# Patient Record
Sex: Male | Born: 1945
Health system: Southern US, Community
[De-identification: ages and names within clinical notes are randomized; demographics above are authoritative.]

## PROBLEM LIST (undated history)

## (undated) DIAGNOSIS — C109 Malignant neoplasm of oropharynx, unspecified: Secondary | ICD-10-CM

## (undated) DIAGNOSIS — F4310 Post-traumatic stress disorder, unspecified: Secondary | ICD-10-CM

## (undated) DIAGNOSIS — M1990 Unspecified osteoarthritis, unspecified site: Secondary | ICD-10-CM

## (undated) DIAGNOSIS — Z01810 Encounter for preprocedural cardiovascular examination: Secondary | ICD-10-CM

## (undated) DIAGNOSIS — N189 Chronic kidney disease, unspecified: Secondary | ICD-10-CM

## (undated) DIAGNOSIS — F32A Depression, unspecified: Secondary | ICD-10-CM

## (undated) DIAGNOSIS — E785 Hyperlipidemia, unspecified: Secondary | ICD-10-CM

## (undated) DIAGNOSIS — K219 Gastro-esophageal reflux disease without esophagitis: Secondary | ICD-10-CM

## (undated) DIAGNOSIS — E039 Hypothyroidism, unspecified: Secondary | ICD-10-CM

## (undated) DIAGNOSIS — I2510 Atherosclerotic heart disease of native coronary artery without angina pectoris: Secondary | ICD-10-CM

## (undated) DIAGNOSIS — F419 Anxiety disorder, unspecified: Secondary | ICD-10-CM

## (undated) DIAGNOSIS — I209 Angina pectoris, unspecified: Secondary | ICD-10-CM

## (undated) DIAGNOSIS — K449 Diaphragmatic hernia without obstruction or gangrene: Secondary | ICD-10-CM

## (undated) DIAGNOSIS — I10 Essential (primary) hypertension: Secondary | ICD-10-CM

## (undated) DIAGNOSIS — G4730 Sleep apnea, unspecified: Secondary | ICD-10-CM

## (undated) DIAGNOSIS — K5790 Diverticulosis of intestine, part unspecified, without perforation or abscess without bleeding: Secondary | ICD-10-CM

## (undated) HISTORY — DX: Hypothyroidism, unspecified: E03.9

## (undated) HISTORY — PX: ANAL FISTULOTOMY: SHX6423

## (undated) HISTORY — DX: Diverticulosis of intestine, part unspecified, without perforation or abscess without bleeding: K57.90

## (undated) HISTORY — DX: Anxiety disorder, unspecified: F41.9

## (undated) HISTORY — DX: Post-traumatic stress disorder, unspecified: F43.10

## (undated) HISTORY — DX: Sleep apnea, unspecified: G47.30

## (undated) HISTORY — PX: HEMORRHOID SURGERY: SHX153

## (undated) HISTORY — DX: Diaphragmatic hernia without obstruction or gangrene: K44.9

## (undated) HISTORY — PX: ARTHROSCOPY KNEE W/ DRILLING: SUR92

## (undated) HISTORY — DX: Gastro-esophageal reflux disease without esophagitis: K21.9

## (undated) HISTORY — PX: CORONARY ANGIOPLASTY: SHX604

---
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
## 2001-04-28 HISTORY — PX: CARDIAC CATHETERIZATION: SHX172

## 2004-04-28 HISTORY — PX: TONSILLECTOMY: SUR1361

## 2004-07-16 ENCOUNTER — Ambulatory Visit: Admission: RE | Admit: 2004-07-16 | Discharge: 2004-10-14 | Payer: Self-pay | Admitting: Radiation Oncology

## 2004-07-17 ENCOUNTER — Ambulatory Visit (HOSPITAL_COMMUNITY): Admission: RE | Admit: 2004-07-17 | Discharge: 2004-07-17 | Payer: Self-pay | Admitting: Radiation Oncology

## 2004-07-18 ENCOUNTER — Ambulatory Visit: Payer: Self-pay | Admitting: Internal Medicine

## 2004-07-18 ENCOUNTER — Encounter: Admission: AD | Admit: 2004-07-18 | Discharge: 2004-07-18 | Payer: Self-pay | Admitting: Dentistry

## 2004-07-18 ENCOUNTER — Ambulatory Visit: Payer: Self-pay | Admitting: Dentistry

## 2004-07-22 ENCOUNTER — Ambulatory Visit: Payer: Self-pay | Admitting: Dentistry

## 2004-07-22 ENCOUNTER — Ambulatory Visit (HOSPITAL_COMMUNITY): Admission: RE | Admit: 2004-07-22 | Discharge: 2004-07-22 | Payer: Self-pay | Admitting: Radiation Oncology

## 2004-09-04 ENCOUNTER — Ambulatory Visit: Payer: Self-pay | Admitting: Internal Medicine

## 2004-10-22 ENCOUNTER — Ambulatory Visit: Payer: Self-pay | Admitting: Internal Medicine

## 2004-11-06 ENCOUNTER — Ambulatory Visit: Payer: Self-pay | Admitting: Dentistry

## 2004-11-21 ENCOUNTER — Ambulatory Visit: Admission: RE | Admit: 2004-11-21 | Discharge: 2004-11-21 | Payer: Self-pay | Admitting: Radiation Oncology

## 2004-12-17 ENCOUNTER — Ambulatory Visit: Payer: Self-pay | Admitting: Internal Medicine

## 2005-03-18 ENCOUNTER — Ambulatory Visit: Payer: Self-pay | Admitting: Internal Medicine

## 2005-07-08 ENCOUNTER — Ambulatory Visit: Payer: Self-pay | Admitting: Internal Medicine

## 2005-07-11 ENCOUNTER — Ambulatory Visit (HOSPITAL_COMMUNITY): Admission: RE | Admit: 2005-07-11 | Discharge: 2005-07-11 | Payer: Self-pay | Admitting: Internal Medicine

## 2005-11-06 ENCOUNTER — Ambulatory Visit: Payer: Self-pay | Admitting: Internal Medicine

## 2005-11-12 LAB — CBC WITH DIFFERENTIAL/PLATELET
Basophils Absolute: 0 10*3/uL (ref 0.0–0.1)
Eosinophils Absolute: 0.1 10*3/uL (ref 0.0–0.5)
HCT: 42 % (ref 38.7–49.9)
HGB: 14.6 g/dL (ref 13.0–17.1)
MCH: 31.7 pg (ref 28.0–33.4)
MCV: 91.4 fL (ref 81.6–98.0)
NEUT#: 4 10*3/uL (ref 1.5–6.5)
NEUT%: 77.6 % — ABNORMAL HIGH (ref 40.0–75.0)
RDW: 13.6 % (ref 11.2–14.6)
lymph#: 0.6 10*3/uL — ABNORMAL LOW (ref 0.9–3.3)

## 2005-11-12 LAB — COMPREHENSIVE METABOLIC PANEL
Albumin: 4.6 g/dL (ref 3.5–5.2)
BUN: 16 mg/dL (ref 6–23)
Calcium: 9.6 mg/dL (ref 8.4–10.5)
Chloride: 102 mEq/L (ref 96–112)
Creatinine, Ser: 1.03 mg/dL (ref 0.40–1.50)
Glucose, Bld: 145 mg/dL — ABNORMAL HIGH (ref 70–99)
Potassium: 3.8 mEq/L (ref 3.5–5.3)

## 2006-02-02 ENCOUNTER — Ambulatory Visit: Payer: Self-pay | Admitting: Internal Medicine

## 2006-02-06 ENCOUNTER — Ambulatory Visit (HOSPITAL_COMMUNITY): Admission: RE | Admit: 2006-02-06 | Discharge: 2006-02-06 | Payer: Self-pay | Admitting: Internal Medicine

## 2006-02-17 ENCOUNTER — Ambulatory Visit (HOSPITAL_COMMUNITY): Admission: RE | Admit: 2006-02-17 | Discharge: 2006-02-17 | Payer: Self-pay | Admitting: Internal Medicine

## 2006-06-26 ENCOUNTER — Ambulatory Visit: Payer: Self-pay | Admitting: Internal Medicine

## 2006-07-01 LAB — COMPREHENSIVE METABOLIC PANEL
AST: 23 U/L (ref 0–37)
Albumin: 4.5 g/dL (ref 3.5–5.2)
BUN: 20 mg/dL (ref 6–23)
CO2: 25 mEq/L (ref 19–32)
Calcium: 9.7 mg/dL (ref 8.4–10.5)
Chloride: 104 mEq/L (ref 96–112)
Potassium: 3.8 mEq/L (ref 3.5–5.3)

## 2006-07-01 LAB — CBC WITH DIFFERENTIAL/PLATELET
Basophils Absolute: 0 10*3/uL (ref 0.0–0.1)
EOS%: 2.4 % (ref 0.0–7.0)
Eosinophils Absolute: 0.2 10*3/uL (ref 0.0–0.5)
HCT: 42.6 % (ref 38.7–49.9)
HGB: 14.9 g/dL (ref 13.0–17.1)
MCH: 31.3 pg (ref 28.0–33.4)
MONO#: 0.5 10*3/uL (ref 0.1–0.9)
NEUT#: 5.7 10*3/uL (ref 1.5–6.5)
RDW: 13.6 % (ref 11.2–14.6)
WBC: 7.1 10*3/uL (ref 4.0–10.0)
lymph#: 0.7 10*3/uL — ABNORMAL LOW (ref 0.9–3.3)

## 2006-07-03 ENCOUNTER — Ambulatory Visit (HOSPITAL_COMMUNITY): Admission: RE | Admit: 2006-07-03 | Discharge: 2006-07-03 | Payer: Self-pay | Admitting: Internal Medicine

## 2006-12-25 ENCOUNTER — Ambulatory Visit: Payer: Self-pay | Admitting: Internal Medicine

## 2006-12-30 LAB — CBC WITH DIFFERENTIAL/PLATELET
BASO%: 0.4 % (ref 0.0–2.0)
Basophils Absolute: 0 10*3/uL (ref 0.0–0.1)
EOS%: 3.3 % (ref 0.0–7.0)
HGB: 14.5 g/dL (ref 13.0–17.1)
MCH: 32 pg (ref 28.0–33.4)
MCHC: 35.4 g/dL (ref 32.0–35.9)
RDW: 13.8 % (ref 11.2–14.6)
WBC: 5.8 10*3/uL (ref 4.0–10.0)
lymph#: 0.7 10*3/uL — ABNORMAL LOW (ref 0.9–3.3)

## 2006-12-30 LAB — COMPREHENSIVE METABOLIC PANEL
ALT: 35 U/L (ref 0–53)
AST: 26 U/L (ref 0–37)
Albumin: 4.4 g/dL (ref 3.5–5.2)
Calcium: 9.9 mg/dL (ref 8.4–10.5)
Chloride: 103 mEq/L (ref 96–112)
Potassium: 4 mEq/L (ref 3.5–5.3)

## 2007-01-01 ENCOUNTER — Ambulatory Visit (HOSPITAL_COMMUNITY): Admission: RE | Admit: 2007-01-01 | Discharge: 2007-01-01 | Payer: Self-pay | Admitting: Internal Medicine

## 2007-06-24 ENCOUNTER — Ambulatory Visit: Payer: Self-pay | Admitting: Internal Medicine

## 2007-06-29 LAB — CBC WITH DIFFERENTIAL/PLATELET
Basophils Absolute: 0 10*3/uL (ref 0.0–0.1)
EOS%: 2.8 % (ref 0.0–7.0)
Eosinophils Absolute: 0.1 10*3/uL (ref 0.0–0.5)
HCT: 42 % (ref 38.7–49.9)
HGB: 14.6 g/dL (ref 13.0–17.1)
MCH: 31.2 pg (ref 28.0–33.4)
MCV: 89.8 fL (ref 81.6–98.0)
MONO%: 8.7 % (ref 0.0–13.0)
NEUT#: 3.7 10*3/uL (ref 1.5–6.5)
NEUT%: 72.3 % (ref 40.0–75.0)
Platelets: 229 10*3/uL (ref 145–400)
RDW: 13.6 % (ref 11.2–14.6)

## 2007-06-29 LAB — COMPREHENSIVE METABOLIC PANEL
AST: 21 U/L (ref 0–37)
Albumin: 4.6 g/dL (ref 3.5–5.2)
Alkaline Phosphatase: 45 U/L (ref 39–117)
BUN: 16 mg/dL (ref 6–23)
Calcium: 9.7 mg/dL (ref 8.4–10.5)
Creatinine, Ser: 0.96 mg/dL (ref 0.40–1.50)
Glucose, Bld: 100 mg/dL — ABNORMAL HIGH (ref 70–99)
Potassium: 3.9 mEq/L (ref 3.5–5.3)

## 2007-07-02 ENCOUNTER — Ambulatory Visit (HOSPITAL_COMMUNITY): Admission: RE | Admit: 2007-07-02 | Discharge: 2007-07-02 | Payer: Self-pay | Admitting: Internal Medicine

## 2008-06-26 ENCOUNTER — Ambulatory Visit: Payer: Self-pay | Admitting: Internal Medicine

## 2008-06-28 ENCOUNTER — Ambulatory Visit (HOSPITAL_COMMUNITY): Admission: RE | Admit: 2008-06-28 | Discharge: 2008-06-28 | Payer: Self-pay | Admitting: Internal Medicine

## 2008-06-28 LAB — CBC WITH DIFFERENTIAL/PLATELET
Basophils Absolute: 0 10*3/uL (ref 0.0–0.1)
EOS%: 2.5 % (ref 0.0–7.0)
Eosinophils Absolute: 0.2 10*3/uL (ref 0.0–0.5)
HGB: 15.5 g/dL (ref 13.0–17.1)
LYMPH%: 11.9 % — ABNORMAL LOW (ref 14.0–49.0)
MCH: 31.1 pg (ref 27.2–33.4)
MCV: 91.3 fL (ref 79.3–98.0)
MONO%: 8.1 % (ref 0.0–14.0)
NEUT%: 77.1 % — ABNORMAL HIGH (ref 39.0–75.0)
Platelets: 194 10*3/uL (ref 140–400)
RDW: 14.2 % (ref 11.0–14.6)

## 2008-06-28 LAB — COMPREHENSIVE METABOLIC PANEL
AST: 28 U/L (ref 0–37)
Alkaline Phosphatase: 48 U/L (ref 39–117)
BUN: 19 mg/dL (ref 6–23)
Creatinine, Ser: 1.08 mg/dL (ref 0.40–1.50)
Glucose, Bld: 123 mg/dL — ABNORMAL HIGH (ref 70–99)
Potassium: 3.5 mEq/L (ref 3.5–5.3)
Total Bilirubin: 1.5 mg/dL — ABNORMAL HIGH (ref 0.3–1.2)

## 2009-06-26 ENCOUNTER — Ambulatory Visit: Payer: Self-pay | Admitting: Internal Medicine

## 2009-06-28 ENCOUNTER — Ambulatory Visit (HOSPITAL_COMMUNITY): Admission: RE | Admit: 2009-06-28 | Discharge: 2009-06-28 | Payer: Self-pay | Admitting: Internal Medicine

## 2009-06-28 LAB — COMPREHENSIVE METABOLIC PANEL
AST: 29 U/L (ref 0–37)
Albumin: 4.3 g/dL (ref 3.5–5.2)
Alkaline Phosphatase: 48 U/L (ref 39–117)
BUN: 14 mg/dL (ref 6–23)
Potassium: 3.6 mEq/L (ref 3.5–5.3)
Sodium: 138 mEq/L (ref 135–145)
Total Bilirubin: 1.3 mg/dL — ABNORMAL HIGH (ref 0.3–1.2)
Total Protein: 7.4 g/dL (ref 6.0–8.3)

## 2009-06-28 LAB — CBC WITH DIFFERENTIAL/PLATELET
EOS%: 3.8 % (ref 0.0–7.0)
LYMPH%: 16.6 % (ref 14.0–49.0)
MCH: 32.2 pg (ref 27.2–33.4)
MCV: 93.7 fL (ref 79.3–98.0)
MONO%: 9.3 % (ref 0.0–14.0)
RBC: 4.71 10*6/uL (ref 4.20–5.82)
RDW: 14.2 % (ref 11.0–14.6)

## 2010-05-19 ENCOUNTER — Encounter: Payer: Self-pay | Admitting: Internal Medicine

## 2011-05-07 DIAGNOSIS — F4310 Post-traumatic stress disorder, unspecified: Secondary | ICD-10-CM | POA: Diagnosis not present

## 2011-05-07 DIAGNOSIS — F40298 Other specified phobia: Secondary | ICD-10-CM | POA: Diagnosis not present

## 2011-05-07 DIAGNOSIS — F429 Obsessive-compulsive disorder, unspecified: Secondary | ICD-10-CM | POA: Diagnosis not present

## 2011-05-19 DIAGNOSIS — E785 Hyperlipidemia, unspecified: Secondary | ICD-10-CM | POA: Diagnosis not present

## 2011-05-19 DIAGNOSIS — I2510 Atherosclerotic heart disease of native coronary artery without angina pectoris: Secondary | ICD-10-CM | POA: Diagnosis not present

## 2011-05-19 DIAGNOSIS — I10 Essential (primary) hypertension: Secondary | ICD-10-CM | POA: Diagnosis not present

## 2011-05-21 DIAGNOSIS — F429 Obsessive-compulsive disorder, unspecified: Secondary | ICD-10-CM | POA: Diagnosis not present

## 2011-05-21 DIAGNOSIS — F4310 Post-traumatic stress disorder, unspecified: Secondary | ICD-10-CM | POA: Diagnosis not present

## 2011-05-21 DIAGNOSIS — F40298 Other specified phobia: Secondary | ICD-10-CM | POA: Diagnosis not present

## 2011-06-04 DIAGNOSIS — F40298 Other specified phobia: Secondary | ICD-10-CM | POA: Diagnosis not present

## 2011-06-04 DIAGNOSIS — F4310 Post-traumatic stress disorder, unspecified: Secondary | ICD-10-CM | POA: Diagnosis not present

## 2011-06-04 DIAGNOSIS — F429 Obsessive-compulsive disorder, unspecified: Secondary | ICD-10-CM | POA: Diagnosis not present

## 2011-06-18 DIAGNOSIS — F429 Obsessive-compulsive disorder, unspecified: Secondary | ICD-10-CM | POA: Diagnosis not present

## 2011-06-18 DIAGNOSIS — F40298 Other specified phobia: Secondary | ICD-10-CM | POA: Diagnosis not present

## 2011-06-18 DIAGNOSIS — F4310 Post-traumatic stress disorder, unspecified: Secondary | ICD-10-CM | POA: Diagnosis not present

## 2011-07-03 DIAGNOSIS — F429 Obsessive-compulsive disorder, unspecified: Secondary | ICD-10-CM | POA: Diagnosis not present

## 2011-07-03 DIAGNOSIS — F40298 Other specified phobia: Secondary | ICD-10-CM | POA: Diagnosis not present

## 2011-07-03 DIAGNOSIS — F4310 Post-traumatic stress disorder, unspecified: Secondary | ICD-10-CM | POA: Diagnosis not present

## 2011-07-21 DIAGNOSIS — F4310 Post-traumatic stress disorder, unspecified: Secondary | ICD-10-CM | POA: Diagnosis not present

## 2011-07-21 DIAGNOSIS — F429 Obsessive-compulsive disorder, unspecified: Secondary | ICD-10-CM | POA: Diagnosis not present

## 2011-07-21 DIAGNOSIS — F40298 Other specified phobia: Secondary | ICD-10-CM | POA: Diagnosis not present

## 2011-07-30 DIAGNOSIS — C109 Malignant neoplasm of oropharynx, unspecified: Secondary | ICD-10-CM

## 2011-07-30 HISTORY — DX: Malignant neoplasm of oropharynx, unspecified: C10.9

## 2011-08-04 DIAGNOSIS — F4310 Post-traumatic stress disorder, unspecified: Secondary | ICD-10-CM | POA: Diagnosis not present

## 2011-08-04 DIAGNOSIS — F429 Obsessive-compulsive disorder, unspecified: Secondary | ICD-10-CM | POA: Diagnosis not present

## 2011-08-04 DIAGNOSIS — F40298 Other specified phobia: Secondary | ICD-10-CM | POA: Diagnosis not present

## 2011-08-20 DIAGNOSIS — F40298 Other specified phobia: Secondary | ICD-10-CM | POA: Diagnosis not present

## 2011-08-20 DIAGNOSIS — F429 Obsessive-compulsive disorder, unspecified: Secondary | ICD-10-CM | POA: Diagnosis not present

## 2011-08-20 DIAGNOSIS — F4310 Post-traumatic stress disorder, unspecified: Secondary | ICD-10-CM | POA: Diagnosis not present

## 2011-09-09 DIAGNOSIS — F4310 Post-traumatic stress disorder, unspecified: Secondary | ICD-10-CM | POA: Diagnosis not present

## 2011-09-09 DIAGNOSIS — F429 Obsessive-compulsive disorder, unspecified: Secondary | ICD-10-CM | POA: Diagnosis not present

## 2011-09-09 DIAGNOSIS — F40298 Other specified phobia: Secondary | ICD-10-CM | POA: Diagnosis not present

## 2011-09-23 DIAGNOSIS — F429 Obsessive-compulsive disorder, unspecified: Secondary | ICD-10-CM | POA: Diagnosis not present

## 2011-09-23 DIAGNOSIS — F4310 Post-traumatic stress disorder, unspecified: Secondary | ICD-10-CM | POA: Diagnosis not present

## 2011-09-23 DIAGNOSIS — F40298 Other specified phobia: Secondary | ICD-10-CM | POA: Diagnosis not present

## 2011-10-07 DIAGNOSIS — F40298 Other specified phobia: Secondary | ICD-10-CM | POA: Diagnosis not present

## 2011-10-07 DIAGNOSIS — F4310 Post-traumatic stress disorder, unspecified: Secondary | ICD-10-CM | POA: Diagnosis not present

## 2011-10-07 DIAGNOSIS — F429 Obsessive-compulsive disorder, unspecified: Secondary | ICD-10-CM | POA: Diagnosis not present

## 2011-10-21 DIAGNOSIS — F429 Obsessive-compulsive disorder, unspecified: Secondary | ICD-10-CM | POA: Diagnosis not present

## 2011-10-21 DIAGNOSIS — F40298 Other specified phobia: Secondary | ICD-10-CM | POA: Diagnosis not present

## 2011-10-21 DIAGNOSIS — F4310 Post-traumatic stress disorder, unspecified: Secondary | ICD-10-CM | POA: Diagnosis not present

## 2011-10-23 DIAGNOSIS — L820 Inflamed seborrheic keratosis: Secondary | ICD-10-CM | POA: Diagnosis not present

## 2011-11-11 DIAGNOSIS — F429 Obsessive-compulsive disorder, unspecified: Secondary | ICD-10-CM | POA: Diagnosis not present

## 2011-11-11 DIAGNOSIS — F4310 Post-traumatic stress disorder, unspecified: Secondary | ICD-10-CM | POA: Diagnosis not present

## 2011-11-11 DIAGNOSIS — F40298 Other specified phobia: Secondary | ICD-10-CM | POA: Diagnosis not present

## 2011-11-26 DIAGNOSIS — F40298 Other specified phobia: Secondary | ICD-10-CM | POA: Diagnosis not present

## 2011-11-26 DIAGNOSIS — F429 Obsessive-compulsive disorder, unspecified: Secondary | ICD-10-CM | POA: Diagnosis not present

## 2011-11-26 DIAGNOSIS — F4310 Post-traumatic stress disorder, unspecified: Secondary | ICD-10-CM | POA: Diagnosis not present

## 2011-12-02 DIAGNOSIS — E039 Hypothyroidism, unspecified: Secondary | ICD-10-CM | POA: Diagnosis not present

## 2011-12-10 DIAGNOSIS — F429 Obsessive-compulsive disorder, unspecified: Secondary | ICD-10-CM | POA: Diagnosis not present

## 2011-12-10 DIAGNOSIS — F40298 Other specified phobia: Secondary | ICD-10-CM | POA: Diagnosis not present

## 2011-12-10 DIAGNOSIS — F4310 Post-traumatic stress disorder, unspecified: Secondary | ICD-10-CM | POA: Diagnosis not present

## 2011-12-12 DIAGNOSIS — M6530 Trigger finger, unspecified finger: Secondary | ICD-10-CM | POA: Diagnosis not present

## 2011-12-12 DIAGNOSIS — M25549 Pain in joints of unspecified hand: Secondary | ICD-10-CM | POA: Diagnosis not present

## 2011-12-24 DIAGNOSIS — F429 Obsessive-compulsive disorder, unspecified: Secondary | ICD-10-CM | POA: Diagnosis not present

## 2011-12-24 DIAGNOSIS — F40298 Other specified phobia: Secondary | ICD-10-CM | POA: Diagnosis not present

## 2011-12-24 DIAGNOSIS — F4310 Post-traumatic stress disorder, unspecified: Secondary | ICD-10-CM | POA: Diagnosis not present

## 2012-01-07 DIAGNOSIS — F40298 Other specified phobia: Secondary | ICD-10-CM | POA: Diagnosis not present

## 2012-01-07 DIAGNOSIS — F429 Obsessive-compulsive disorder, unspecified: Secondary | ICD-10-CM | POA: Diagnosis not present

## 2012-01-07 DIAGNOSIS — F4310 Post-traumatic stress disorder, unspecified: Secondary | ICD-10-CM | POA: Diagnosis not present

## 2012-01-14 DIAGNOSIS — M6530 Trigger finger, unspecified finger: Secondary | ICD-10-CM | POA: Diagnosis not present

## 2012-01-21 DIAGNOSIS — F429 Obsessive-compulsive disorder, unspecified: Secondary | ICD-10-CM | POA: Diagnosis not present

## 2012-01-21 DIAGNOSIS — F40298 Other specified phobia: Secondary | ICD-10-CM | POA: Diagnosis not present

## 2012-01-21 DIAGNOSIS — F4310 Post-traumatic stress disorder, unspecified: Secondary | ICD-10-CM | POA: Diagnosis not present

## 2012-01-29 DIAGNOSIS — Z23 Encounter for immunization: Secondary | ICD-10-CM | POA: Diagnosis not present

## 2012-02-09 DIAGNOSIS — F4310 Post-traumatic stress disorder, unspecified: Secondary | ICD-10-CM | POA: Diagnosis not present

## 2012-02-09 DIAGNOSIS — F429 Obsessive-compulsive disorder, unspecified: Secondary | ICD-10-CM | POA: Diagnosis not present

## 2012-02-09 DIAGNOSIS — F40298 Other specified phobia: Secondary | ICD-10-CM | POA: Diagnosis not present

## 2012-02-23 DIAGNOSIS — F429 Obsessive-compulsive disorder, unspecified: Secondary | ICD-10-CM | POA: Diagnosis not present

## 2012-02-23 DIAGNOSIS — F4310 Post-traumatic stress disorder, unspecified: Secondary | ICD-10-CM | POA: Diagnosis not present

## 2012-02-23 DIAGNOSIS — F40298 Other specified phobia: Secondary | ICD-10-CM | POA: Diagnosis not present

## 2012-03-09 DIAGNOSIS — F40298 Other specified phobia: Secondary | ICD-10-CM | POA: Diagnosis not present

## 2012-03-09 DIAGNOSIS — F429 Obsessive-compulsive disorder, unspecified: Secondary | ICD-10-CM | POA: Diagnosis not present

## 2012-03-09 DIAGNOSIS — F4310 Post-traumatic stress disorder, unspecified: Secondary | ICD-10-CM | POA: Diagnosis not present

## 2012-03-30 DIAGNOSIS — F40298 Other specified phobia: Secondary | ICD-10-CM | POA: Diagnosis not present

## 2012-03-30 DIAGNOSIS — F429 Obsessive-compulsive disorder, unspecified: Secondary | ICD-10-CM | POA: Diagnosis not present

## 2012-03-30 DIAGNOSIS — F4310 Post-traumatic stress disorder, unspecified: Secondary | ICD-10-CM | POA: Diagnosis not present

## 2012-04-14 DIAGNOSIS — F4310 Post-traumatic stress disorder, unspecified: Secondary | ICD-10-CM | POA: Diagnosis not present

## 2012-04-14 DIAGNOSIS — F429 Obsessive-compulsive disorder, unspecified: Secondary | ICD-10-CM | POA: Diagnosis not present

## 2012-04-14 DIAGNOSIS — F40298 Other specified phobia: Secondary | ICD-10-CM | POA: Diagnosis not present

## 2012-05-17 DIAGNOSIS — F40298 Other specified phobia: Secondary | ICD-10-CM | POA: Diagnosis not present

## 2012-05-17 DIAGNOSIS — F4310 Post-traumatic stress disorder, unspecified: Secondary | ICD-10-CM | POA: Diagnosis not present

## 2012-05-17 DIAGNOSIS — F429 Obsessive-compulsive disorder, unspecified: Secondary | ICD-10-CM | POA: Diagnosis not present

## 2012-05-18 DIAGNOSIS — J3089 Other allergic rhinitis: Secondary | ICD-10-CM | POA: Diagnosis not present

## 2012-05-18 DIAGNOSIS — E7800 Pure hypercholesterolemia, unspecified: Secondary | ICD-10-CM | POA: Diagnosis not present

## 2012-05-18 DIAGNOSIS — I10 Essential (primary) hypertension: Secondary | ICD-10-CM | POA: Diagnosis not present

## 2012-05-18 DIAGNOSIS — Z79899 Other long term (current) drug therapy: Secondary | ICD-10-CM | POA: Diagnosis not present

## 2012-05-31 DIAGNOSIS — F4310 Post-traumatic stress disorder, unspecified: Secondary | ICD-10-CM | POA: Diagnosis not present

## 2012-05-31 DIAGNOSIS — F40298 Other specified phobia: Secondary | ICD-10-CM | POA: Diagnosis not present

## 2012-05-31 DIAGNOSIS — F429 Obsessive-compulsive disorder, unspecified: Secondary | ICD-10-CM | POA: Diagnosis not present

## 2012-06-15 DIAGNOSIS — F429 Obsessive-compulsive disorder, unspecified: Secondary | ICD-10-CM | POA: Diagnosis not present

## 2012-06-15 DIAGNOSIS — F40298 Other specified phobia: Secondary | ICD-10-CM | POA: Diagnosis not present

## 2012-06-15 DIAGNOSIS — F4310 Post-traumatic stress disorder, unspecified: Secondary | ICD-10-CM | POA: Diagnosis not present

## 2012-07-01 DIAGNOSIS — F429 Obsessive-compulsive disorder, unspecified: Secondary | ICD-10-CM | POA: Diagnosis not present

## 2012-07-01 DIAGNOSIS — F40298 Other specified phobia: Secondary | ICD-10-CM | POA: Diagnosis not present

## 2012-07-01 DIAGNOSIS — F4310 Post-traumatic stress disorder, unspecified: Secondary | ICD-10-CM | POA: Diagnosis not present

## 2012-07-13 DIAGNOSIS — F40298 Other specified phobia: Secondary | ICD-10-CM | POA: Diagnosis not present

## 2012-07-13 DIAGNOSIS — F4310 Post-traumatic stress disorder, unspecified: Secondary | ICD-10-CM | POA: Diagnosis not present

## 2012-07-13 DIAGNOSIS — F429 Obsessive-compulsive disorder, unspecified: Secondary | ICD-10-CM | POA: Diagnosis not present

## 2012-07-27 DIAGNOSIS — F429 Obsessive-compulsive disorder, unspecified: Secondary | ICD-10-CM | POA: Diagnosis not present

## 2012-07-27 DIAGNOSIS — F4310 Post-traumatic stress disorder, unspecified: Secondary | ICD-10-CM | POA: Diagnosis not present

## 2012-07-27 DIAGNOSIS — F40298 Other specified phobia: Secondary | ICD-10-CM | POA: Diagnosis not present

## 2012-08-26 DIAGNOSIS — F40298 Other specified phobia: Secondary | ICD-10-CM | POA: Diagnosis not present

## 2012-08-26 DIAGNOSIS — F4310 Post-traumatic stress disorder, unspecified: Secondary | ICD-10-CM | POA: Diagnosis not present

## 2012-08-26 DIAGNOSIS — F429 Obsessive-compulsive disorder, unspecified: Secondary | ICD-10-CM | POA: Diagnosis not present

## 2012-09-02 DIAGNOSIS — L570 Actinic keratosis: Secondary | ICD-10-CM | POA: Diagnosis not present

## 2012-09-02 DIAGNOSIS — L821 Other seborrheic keratosis: Secondary | ICD-10-CM | POA: Diagnosis not present

## 2012-09-21 DIAGNOSIS — F40298 Other specified phobia: Secondary | ICD-10-CM | POA: Diagnosis not present

## 2012-09-21 DIAGNOSIS — F4310 Post-traumatic stress disorder, unspecified: Secondary | ICD-10-CM | POA: Diagnosis not present

## 2012-09-21 DIAGNOSIS — F429 Obsessive-compulsive disorder, unspecified: Secondary | ICD-10-CM | POA: Diagnosis not present

## 2012-10-15 DIAGNOSIS — F4310 Post-traumatic stress disorder, unspecified: Secondary | ICD-10-CM | POA: Diagnosis not present

## 2012-10-15 DIAGNOSIS — F40298 Other specified phobia: Secondary | ICD-10-CM | POA: Diagnosis not present

## 2012-10-15 DIAGNOSIS — F429 Obsessive-compulsive disorder, unspecified: Secondary | ICD-10-CM | POA: Diagnosis not present

## 2012-10-28 DIAGNOSIS — F40298 Other specified phobia: Secondary | ICD-10-CM | POA: Diagnosis not present

## 2012-10-28 DIAGNOSIS — F429 Obsessive-compulsive disorder, unspecified: Secondary | ICD-10-CM | POA: Diagnosis not present

## 2012-10-28 DIAGNOSIS — F4310 Post-traumatic stress disorder, unspecified: Secondary | ICD-10-CM | POA: Diagnosis not present

## 2012-11-12 DIAGNOSIS — F4310 Post-traumatic stress disorder, unspecified: Secondary | ICD-10-CM | POA: Diagnosis not present

## 2012-11-12 DIAGNOSIS — F429 Obsessive-compulsive disorder, unspecified: Secondary | ICD-10-CM | POA: Diagnosis not present

## 2012-11-12 DIAGNOSIS — F40298 Other specified phobia: Secondary | ICD-10-CM | POA: Diagnosis not present

## 2012-11-25 DIAGNOSIS — L821 Other seborrheic keratosis: Secondary | ICD-10-CM | POA: Diagnosis not present

## 2012-11-25 DIAGNOSIS — L570 Actinic keratosis: Secondary | ICD-10-CM | POA: Diagnosis not present

## 2012-11-25 DIAGNOSIS — D235 Other benign neoplasm of skin of trunk: Secondary | ICD-10-CM | POA: Diagnosis not present

## 2012-11-25 DIAGNOSIS — L820 Inflamed seborrheic keratosis: Secondary | ICD-10-CM | POA: Diagnosis not present

## 2012-12-10 DIAGNOSIS — F4310 Post-traumatic stress disorder, unspecified: Secondary | ICD-10-CM | POA: Diagnosis not present

## 2012-12-10 DIAGNOSIS — F429 Obsessive-compulsive disorder, unspecified: Secondary | ICD-10-CM | POA: Diagnosis not present

## 2012-12-24 DIAGNOSIS — F429 Obsessive-compulsive disorder, unspecified: Secondary | ICD-10-CM | POA: Diagnosis not present

## 2012-12-24 DIAGNOSIS — F4310 Post-traumatic stress disorder, unspecified: Secondary | ICD-10-CM | POA: Diagnosis not present

## 2012-12-24 DIAGNOSIS — F40298 Other specified phobia: Secondary | ICD-10-CM | POA: Diagnosis not present

## 2013-01-07 DIAGNOSIS — F40298 Other specified phobia: Secondary | ICD-10-CM | POA: Diagnosis not present

## 2013-01-07 DIAGNOSIS — F4310 Post-traumatic stress disorder, unspecified: Secondary | ICD-10-CM | POA: Diagnosis not present

## 2013-01-07 DIAGNOSIS — F429 Obsessive-compulsive disorder, unspecified: Secondary | ICD-10-CM | POA: Diagnosis not present

## 2013-01-21 DIAGNOSIS — F429 Obsessive-compulsive disorder, unspecified: Secondary | ICD-10-CM | POA: Diagnosis not present

## 2013-01-21 DIAGNOSIS — F40298 Other specified phobia: Secondary | ICD-10-CM | POA: Diagnosis not present

## 2013-01-21 DIAGNOSIS — F4310 Post-traumatic stress disorder, unspecified: Secondary | ICD-10-CM | POA: Diagnosis not present

## 2013-01-27 DIAGNOSIS — Z23 Encounter for immunization: Secondary | ICD-10-CM | POA: Diagnosis not present

## 2013-02-04 DIAGNOSIS — F429 Obsessive-compulsive disorder, unspecified: Secondary | ICD-10-CM | POA: Diagnosis not present

## 2013-02-04 DIAGNOSIS — F4310 Post-traumatic stress disorder, unspecified: Secondary | ICD-10-CM | POA: Diagnosis not present

## 2013-02-04 DIAGNOSIS — F40298 Other specified phobia: Secondary | ICD-10-CM | POA: Diagnosis not present

## 2013-03-02 DIAGNOSIS — F4310 Post-traumatic stress disorder, unspecified: Secondary | ICD-10-CM | POA: Diagnosis not present

## 2013-03-02 DIAGNOSIS — F40298 Other specified phobia: Secondary | ICD-10-CM | POA: Diagnosis not present

## 2013-03-02 DIAGNOSIS — F429 Obsessive-compulsive disorder, unspecified: Secondary | ICD-10-CM | POA: Diagnosis not present

## 2013-03-11 DIAGNOSIS — F40298 Other specified phobia: Secondary | ICD-10-CM | POA: Diagnosis not present

## 2013-03-11 DIAGNOSIS — F4310 Post-traumatic stress disorder, unspecified: Secondary | ICD-10-CM | POA: Diagnosis not present

## 2013-03-11 DIAGNOSIS — F429 Obsessive-compulsive disorder, unspecified: Secondary | ICD-10-CM | POA: Diagnosis not present

## 2013-04-06 DIAGNOSIS — F4310 Post-traumatic stress disorder, unspecified: Secondary | ICD-10-CM | POA: Diagnosis not present

## 2013-04-06 DIAGNOSIS — F429 Obsessive-compulsive disorder, unspecified: Secondary | ICD-10-CM | POA: Diagnosis not present

## 2013-04-06 DIAGNOSIS — F40298 Other specified phobia: Secondary | ICD-10-CM | POA: Diagnosis not present

## 2013-04-18 DIAGNOSIS — F4310 Post-traumatic stress disorder, unspecified: Secondary | ICD-10-CM | POA: Diagnosis not present

## 2013-04-18 DIAGNOSIS — F40298 Other specified phobia: Secondary | ICD-10-CM | POA: Diagnosis not present

## 2013-04-18 DIAGNOSIS — F429 Obsessive-compulsive disorder, unspecified: Secondary | ICD-10-CM | POA: Diagnosis not present

## 2013-05-02 DIAGNOSIS — F429 Obsessive-compulsive disorder, unspecified: Secondary | ICD-10-CM | POA: Diagnosis not present

## 2013-05-02 DIAGNOSIS — F40298 Other specified phobia: Secondary | ICD-10-CM | POA: Diagnosis not present

## 2013-05-02 DIAGNOSIS — F4310 Post-traumatic stress disorder, unspecified: Secondary | ICD-10-CM | POA: Diagnosis not present

## 2013-05-16 DIAGNOSIS — F4310 Post-traumatic stress disorder, unspecified: Secondary | ICD-10-CM | POA: Diagnosis not present

## 2013-05-16 DIAGNOSIS — F40298 Other specified phobia: Secondary | ICD-10-CM | POA: Diagnosis not present

## 2013-05-16 DIAGNOSIS — F429 Obsessive-compulsive disorder, unspecified: Secondary | ICD-10-CM | POA: Diagnosis not present

## 2013-07-01 DIAGNOSIS — F40298 Other specified phobia: Secondary | ICD-10-CM | POA: Diagnosis not present

## 2013-07-01 DIAGNOSIS — F4310 Post-traumatic stress disorder, unspecified: Secondary | ICD-10-CM | POA: Diagnosis not present

## 2013-07-01 DIAGNOSIS — F429 Obsessive-compulsive disorder, unspecified: Secondary | ICD-10-CM | POA: Diagnosis not present

## 2013-07-14 DIAGNOSIS — I10 Essential (primary) hypertension: Secondary | ICD-10-CM | POA: Diagnosis not present

## 2013-07-14 DIAGNOSIS — Z125 Encounter for screening for malignant neoplasm of prostate: Secondary | ICD-10-CM | POA: Diagnosis not present

## 2013-07-14 DIAGNOSIS — E7800 Pure hypercholesterolemia, unspecified: Secondary | ICD-10-CM | POA: Diagnosis not present

## 2013-07-14 DIAGNOSIS — Z79899 Other long term (current) drug therapy: Secondary | ICD-10-CM | POA: Diagnosis not present

## 2013-07-14 DIAGNOSIS — Z23 Encounter for immunization: Secondary | ICD-10-CM | POA: Diagnosis not present

## 2013-07-14 DIAGNOSIS — K219 Gastro-esophageal reflux disease without esophagitis: Secondary | ICD-10-CM | POA: Diagnosis not present

## 2013-07-14 DIAGNOSIS — E039 Hypothyroidism, unspecified: Secondary | ICD-10-CM | POA: Diagnosis not present

## 2013-07-14 DIAGNOSIS — F4310 Post-traumatic stress disorder, unspecified: Secondary | ICD-10-CM | POA: Diagnosis not present

## 2013-07-22 DIAGNOSIS — F40298 Other specified phobia: Secondary | ICD-10-CM | POA: Diagnosis not present

## 2013-07-22 DIAGNOSIS — F4310 Post-traumatic stress disorder, unspecified: Secondary | ICD-10-CM | POA: Diagnosis not present

## 2013-07-22 DIAGNOSIS — F429 Obsessive-compulsive disorder, unspecified: Secondary | ICD-10-CM | POA: Diagnosis not present

## 2013-07-28 DIAGNOSIS — R7309 Other abnormal glucose: Secondary | ICD-10-CM | POA: Diagnosis not present

## 2013-08-18 DIAGNOSIS — F429 Obsessive-compulsive disorder, unspecified: Secondary | ICD-10-CM | POA: Diagnosis not present

## 2013-08-18 DIAGNOSIS — F4310 Post-traumatic stress disorder, unspecified: Secondary | ICD-10-CM | POA: Diagnosis not present

## 2013-08-18 DIAGNOSIS — F40298 Other specified phobia: Secondary | ICD-10-CM | POA: Diagnosis not present

## 2013-09-12 DIAGNOSIS — F429 Obsessive-compulsive disorder, unspecified: Secondary | ICD-10-CM | POA: Diagnosis not present

## 2013-09-12 DIAGNOSIS — F4310 Post-traumatic stress disorder, unspecified: Secondary | ICD-10-CM | POA: Diagnosis not present

## 2013-09-12 DIAGNOSIS — F40298 Other specified phobia: Secondary | ICD-10-CM | POA: Diagnosis not present

## 2013-10-04 DIAGNOSIS — F4310 Post-traumatic stress disorder, unspecified: Secondary | ICD-10-CM | POA: Diagnosis not present

## 2013-10-04 DIAGNOSIS — F40298 Other specified phobia: Secondary | ICD-10-CM | POA: Diagnosis not present

## 2013-10-04 DIAGNOSIS — F429 Obsessive-compulsive disorder, unspecified: Secondary | ICD-10-CM | POA: Diagnosis not present

## 2013-10-25 DIAGNOSIS — F40298 Other specified phobia: Secondary | ICD-10-CM | POA: Diagnosis not present

## 2013-10-25 DIAGNOSIS — F429 Obsessive-compulsive disorder, unspecified: Secondary | ICD-10-CM | POA: Diagnosis not present

## 2013-10-25 DIAGNOSIS — F4310 Post-traumatic stress disorder, unspecified: Secondary | ICD-10-CM | POA: Diagnosis not present

## 2013-11-17 DIAGNOSIS — F4310 Post-traumatic stress disorder, unspecified: Secondary | ICD-10-CM | POA: Diagnosis not present

## 2013-11-17 DIAGNOSIS — F429 Obsessive-compulsive disorder, unspecified: Secondary | ICD-10-CM | POA: Diagnosis not present

## 2013-11-17 DIAGNOSIS — F40298 Other specified phobia: Secondary | ICD-10-CM | POA: Diagnosis not present

## 2013-12-07 DIAGNOSIS — F4310 Post-traumatic stress disorder, unspecified: Secondary | ICD-10-CM | POA: Diagnosis not present

## 2013-12-07 DIAGNOSIS — F429 Obsessive-compulsive disorder, unspecified: Secondary | ICD-10-CM | POA: Diagnosis not present

## 2013-12-07 DIAGNOSIS — F40298 Other specified phobia: Secondary | ICD-10-CM | POA: Diagnosis not present

## 2013-12-21 DIAGNOSIS — F40298 Other specified phobia: Secondary | ICD-10-CM | POA: Diagnosis not present

## 2013-12-21 DIAGNOSIS — F429 Obsessive-compulsive disorder, unspecified: Secondary | ICD-10-CM | POA: Diagnosis not present

## 2013-12-21 DIAGNOSIS — F4310 Post-traumatic stress disorder, unspecified: Secondary | ICD-10-CM | POA: Diagnosis not present

## 2014-01-04 DIAGNOSIS — F429 Obsessive-compulsive disorder, unspecified: Secondary | ICD-10-CM | POA: Diagnosis not present

## 2014-01-04 DIAGNOSIS — F40298 Other specified phobia: Secondary | ICD-10-CM | POA: Diagnosis not present

## 2014-01-04 DIAGNOSIS — F4310 Post-traumatic stress disorder, unspecified: Secondary | ICD-10-CM | POA: Diagnosis not present

## 2014-01-17 DIAGNOSIS — Z23 Encounter for immunization: Secondary | ICD-10-CM | POA: Diagnosis not present

## 2014-01-25 DIAGNOSIS — F429 Obsessive-compulsive disorder, unspecified: Secondary | ICD-10-CM | POA: Diagnosis not present

## 2014-01-25 DIAGNOSIS — F40298 Other specified phobia: Secondary | ICD-10-CM | POA: Diagnosis not present

## 2014-01-25 DIAGNOSIS — F4310 Post-traumatic stress disorder, unspecified: Secondary | ICD-10-CM | POA: Diagnosis not present

## 2014-02-08 DIAGNOSIS — F4310 Post-traumatic stress disorder, unspecified: Secondary | ICD-10-CM | POA: Diagnosis not present

## 2014-02-08 DIAGNOSIS — F40298 Other specified phobia: Secondary | ICD-10-CM | POA: Diagnosis not present

## 2014-02-08 DIAGNOSIS — F42 Obsessive-compulsive disorder: Secondary | ICD-10-CM | POA: Diagnosis not present

## 2014-02-22 DIAGNOSIS — F4310 Post-traumatic stress disorder, unspecified: Secondary | ICD-10-CM | POA: Diagnosis not present

## 2014-02-22 DIAGNOSIS — F42 Obsessive-compulsive disorder: Secondary | ICD-10-CM | POA: Diagnosis not present

## 2014-02-22 DIAGNOSIS — F40298 Other specified phobia: Secondary | ICD-10-CM | POA: Diagnosis not present

## 2014-03-27 DIAGNOSIS — F4310 Post-traumatic stress disorder, unspecified: Secondary | ICD-10-CM | POA: Diagnosis not present

## 2014-03-27 DIAGNOSIS — F42 Obsessive-compulsive disorder: Secondary | ICD-10-CM | POA: Diagnosis not present

## 2014-03-27 DIAGNOSIS — F40298 Other specified phobia: Secondary | ICD-10-CM | POA: Diagnosis not present

## 2014-04-10 DIAGNOSIS — F4310 Post-traumatic stress disorder, unspecified: Secondary | ICD-10-CM | POA: Diagnosis not present

## 2014-04-10 DIAGNOSIS — F42 Obsessive-compulsive disorder: Secondary | ICD-10-CM | POA: Diagnosis not present

## 2014-04-10 DIAGNOSIS — F40298 Other specified phobia: Secondary | ICD-10-CM | POA: Diagnosis not present

## 2014-04-24 DIAGNOSIS — F4310 Post-traumatic stress disorder, unspecified: Secondary | ICD-10-CM | POA: Diagnosis not present

## 2014-04-24 DIAGNOSIS — F42 Obsessive-compulsive disorder: Secondary | ICD-10-CM | POA: Diagnosis not present

## 2014-04-24 DIAGNOSIS — F40298 Other specified phobia: Secondary | ICD-10-CM | POA: Diagnosis not present

## 2014-08-31 DIAGNOSIS — I2510 Atherosclerotic heart disease of native coronary artery without angina pectoris: Secondary | ICD-10-CM | POA: Diagnosis not present

## 2014-08-31 DIAGNOSIS — Z125 Encounter for screening for malignant neoplasm of prostate: Secondary | ICD-10-CM | POA: Diagnosis not present

## 2014-08-31 DIAGNOSIS — I10 Essential (primary) hypertension: Secondary | ICD-10-CM | POA: Diagnosis not present

## 2014-08-31 DIAGNOSIS — Z79899 Other long term (current) drug therapy: Secondary | ICD-10-CM | POA: Diagnosis not present

## 2014-08-31 DIAGNOSIS — E780 Pure hypercholesterolemia: Secondary | ICD-10-CM | POA: Diagnosis not present

## 2014-08-31 DIAGNOSIS — E039 Hypothyroidism, unspecified: Secondary | ICD-10-CM | POA: Diagnosis not present

## 2014-08-31 DIAGNOSIS — R7309 Other abnormal glucose: Secondary | ICD-10-CM | POA: Diagnosis not present

## 2014-08-31 DIAGNOSIS — Z0000 Encounter for general adult medical examination without abnormal findings: Secondary | ICD-10-CM | POA: Diagnosis not present

## 2014-09-01 DIAGNOSIS — I2510 Atherosclerotic heart disease of native coronary artery without angina pectoris: Secondary | ICD-10-CM | POA: Diagnosis not present

## 2014-09-01 DIAGNOSIS — E039 Hypothyroidism, unspecified: Secondary | ICD-10-CM | POA: Diagnosis not present

## 2014-09-01 DIAGNOSIS — J3089 Other allergic rhinitis: Secondary | ICD-10-CM | POA: Diagnosis not present

## 2014-10-02 DIAGNOSIS — E780 Pure hypercholesterolemia: Secondary | ICD-10-CM | POA: Diagnosis not present

## 2014-10-02 DIAGNOSIS — E039 Hypothyroidism, unspecified: Secondary | ICD-10-CM | POA: Diagnosis not present

## 2014-10-02 DIAGNOSIS — I10 Essential (primary) hypertension: Secondary | ICD-10-CM | POA: Diagnosis not present

## 2014-10-02 DIAGNOSIS — I2510 Atherosclerotic heart disease of native coronary artery without angina pectoris: Secondary | ICD-10-CM | POA: Diagnosis not present

## 2014-11-01 DIAGNOSIS — I2510 Atherosclerotic heart disease of native coronary artery without angina pectoris: Secondary | ICD-10-CM | POA: Diagnosis not present

## 2014-11-01 DIAGNOSIS — I10 Essential (primary) hypertension: Secondary | ICD-10-CM | POA: Diagnosis not present

## 2014-11-01 DIAGNOSIS — E039 Hypothyroidism, unspecified: Secondary | ICD-10-CM | POA: Diagnosis not present

## 2014-11-01 DIAGNOSIS — E780 Pure hypercholesterolemia: Secondary | ICD-10-CM | POA: Diagnosis not present

## 2014-12-02 DIAGNOSIS — E039 Hypothyroidism, unspecified: Secondary | ICD-10-CM | POA: Diagnosis not present

## 2014-12-02 DIAGNOSIS — J309 Allergic rhinitis, unspecified: Secondary | ICD-10-CM | POA: Diagnosis not present

## 2014-12-02 DIAGNOSIS — K219 Gastro-esophageal reflux disease without esophagitis: Secondary | ICD-10-CM | POA: Diagnosis not present

## 2014-12-02 DIAGNOSIS — E119 Type 2 diabetes mellitus without complications: Secondary | ICD-10-CM | POA: Diagnosis not present

## 2015-02-14 DIAGNOSIS — D128 Benign neoplasm of rectum: Secondary | ICD-10-CM | POA: Diagnosis not present

## 2015-02-14 DIAGNOSIS — Z1211 Encounter for screening for malignant neoplasm of colon: Secondary | ICD-10-CM | POA: Diagnosis not present

## 2015-02-14 DIAGNOSIS — K621 Rectal polyp: Secondary | ICD-10-CM | POA: Diagnosis not present

## 2015-07-04 DIAGNOSIS — M25462 Effusion, left knee: Secondary | ICD-10-CM | POA: Diagnosis not present

## 2015-07-04 DIAGNOSIS — M25562 Pain in left knee: Secondary | ICD-10-CM | POA: Diagnosis not present

## 2015-07-31 DIAGNOSIS — S83412A Sprain of medial collateral ligament of left knee, initial encounter: Secondary | ICD-10-CM | POA: Diagnosis not present

## 2015-07-31 DIAGNOSIS — R609 Edema, unspecified: Secondary | ICD-10-CM | POA: Diagnosis not present

## 2015-07-31 DIAGNOSIS — S83242A Other tear of medial meniscus, current injury, left knee, initial encounter: Secondary | ICD-10-CM | POA: Diagnosis not present

## 2015-07-31 DIAGNOSIS — M25562 Pain in left knee: Secondary | ICD-10-CM | POA: Diagnosis not present

## 2015-07-31 DIAGNOSIS — M25462 Effusion, left knee: Secondary | ICD-10-CM | POA: Diagnosis not present

## 2015-07-31 DIAGNOSIS — Y939 Activity, unspecified: Secondary | ICD-10-CM | POA: Diagnosis not present

## 2015-07-31 DIAGNOSIS — X58XXXA Exposure to other specified factors, initial encounter: Secondary | ICD-10-CM | POA: Diagnosis not present

## 2015-08-01 DIAGNOSIS — I2510 Atherosclerotic heart disease of native coronary artery without angina pectoris: Secondary | ICD-10-CM

## 2015-08-01 DIAGNOSIS — E785 Hyperlipidemia, unspecified: Secondary | ICD-10-CM

## 2015-08-01 DIAGNOSIS — I10 Essential (primary) hypertension: Secondary | ICD-10-CM

## 2015-08-01 DIAGNOSIS — Z01810 Encounter for preprocedural cardiovascular examination: Secondary | ICD-10-CM | POA: Insufficient documentation

## 2015-08-01 DIAGNOSIS — M25562 Pain in left knee: Secondary | ICD-10-CM | POA: Diagnosis not present

## 2015-08-01 DIAGNOSIS — S83232A Complex tear of medial meniscus, current injury, left knee, initial encounter: Secondary | ICD-10-CM | POA: Diagnosis not present

## 2015-08-01 HISTORY — DX: Essential (primary) hypertension: I10

## 2015-08-01 HISTORY — DX: Encounter for preprocedural cardiovascular examination: Z01.810

## 2015-08-01 HISTORY — DX: Hyperlipidemia, unspecified: E78.5

## 2015-08-01 HISTORY — DX: Atherosclerotic heart disease of native coronary artery without angina pectoris: I25.10

## 2015-08-02 DIAGNOSIS — Z01810 Encounter for preprocedural cardiovascular examination: Secondary | ICD-10-CM | POA: Diagnosis not present

## 2015-08-02 DIAGNOSIS — E785 Hyperlipidemia, unspecified: Secondary | ICD-10-CM | POA: Diagnosis not present

## 2015-08-02 DIAGNOSIS — I10 Essential (primary) hypertension: Secondary | ICD-10-CM | POA: Diagnosis not present

## 2015-08-02 DIAGNOSIS — I2510 Atherosclerotic heart disease of native coronary artery without angina pectoris: Secondary | ICD-10-CM | POA: Diagnosis not present

## 2015-08-08 DIAGNOSIS — Z01818 Encounter for other preprocedural examination: Secondary | ICD-10-CM | POA: Diagnosis not present

## 2015-08-17 DIAGNOSIS — F4310 Post-traumatic stress disorder, unspecified: Secondary | ICD-10-CM | POA: Diagnosis not present

## 2015-08-17 DIAGNOSIS — Z955 Presence of coronary angioplasty implant and graft: Secondary | ICD-10-CM | POA: Diagnosis not present

## 2015-08-17 DIAGNOSIS — K219 Gastro-esophageal reflux disease without esophagitis: Secondary | ICD-10-CM | POA: Diagnosis not present

## 2015-08-17 DIAGNOSIS — M23204 Derangement of unspecified medial meniscus due to old tear or injury, left knee: Secondary | ICD-10-CM | POA: Diagnosis not present

## 2015-08-17 DIAGNOSIS — E039 Hypothyroidism, unspecified: Secondary | ICD-10-CM | POA: Diagnosis not present

## 2015-08-17 DIAGNOSIS — I2510 Atherosclerotic heart disease of native coronary artery without angina pectoris: Secondary | ICD-10-CM | POA: Diagnosis not present

## 2015-08-17 DIAGNOSIS — S83232A Complex tear of medial meniscus, current injury, left knee, initial encounter: Secondary | ICD-10-CM | POA: Diagnosis not present

## 2015-08-17 DIAGNOSIS — I10 Essential (primary) hypertension: Secondary | ICD-10-CM | POA: Diagnosis not present

## 2015-08-17 DIAGNOSIS — M25562 Pain in left knee: Secondary | ICD-10-CM | POA: Diagnosis not present

## 2015-08-17 DIAGNOSIS — Z79899 Other long term (current) drug therapy: Secondary | ICD-10-CM | POA: Diagnosis not present

## 2015-08-17 DIAGNOSIS — F329 Major depressive disorder, single episode, unspecified: Secondary | ICD-10-CM | POA: Diagnosis not present

## 2015-08-17 DIAGNOSIS — K589 Irritable bowel syndrome without diarrhea: Secondary | ICD-10-CM | POA: Diagnosis not present

## 2015-08-17 DIAGNOSIS — M1990 Unspecified osteoarthritis, unspecified site: Secondary | ICD-10-CM | POA: Diagnosis not present

## 2015-08-17 DIAGNOSIS — E785 Hyperlipidemia, unspecified: Secondary | ICD-10-CM | POA: Diagnosis not present

## 2015-08-17 DIAGNOSIS — E7800 Pure hypercholesterolemia, unspecified: Secondary | ICD-10-CM | POA: Diagnosis not present

## 2015-08-17 DIAGNOSIS — F419 Anxiety disorder, unspecified: Secondary | ICD-10-CM | POA: Diagnosis not present

## 2015-08-17 DIAGNOSIS — M65862 Other synovitis and tenosynovitis, left lower leg: Secondary | ICD-10-CM | POA: Diagnosis not present

## 2015-08-21 DIAGNOSIS — S83232D Complex tear of medial meniscus, current injury, left knee, subsequent encounter: Secondary | ICD-10-CM | POA: Diagnosis not present

## 2015-08-21 DIAGNOSIS — M25562 Pain in left knee: Secondary | ICD-10-CM | POA: Diagnosis not present

## 2015-08-21 DIAGNOSIS — M6281 Muscle weakness (generalized): Secondary | ICD-10-CM | POA: Diagnosis not present

## 2015-08-21 DIAGNOSIS — R2689 Other abnormalities of gait and mobility: Secondary | ICD-10-CM | POA: Diagnosis not present

## 2015-08-24 DIAGNOSIS — M6281 Muscle weakness (generalized): Secondary | ICD-10-CM | POA: Diagnosis not present

## 2015-08-24 DIAGNOSIS — R2689 Other abnormalities of gait and mobility: Secondary | ICD-10-CM | POA: Diagnosis not present

## 2015-08-24 DIAGNOSIS — M25562 Pain in left knee: Secondary | ICD-10-CM | POA: Diagnosis not present

## 2015-08-24 DIAGNOSIS — S83232D Complex tear of medial meniscus, current injury, left knee, subsequent encounter: Secondary | ICD-10-CM | POA: Diagnosis not present

## 2015-08-28 DIAGNOSIS — R2689 Other abnormalities of gait and mobility: Secondary | ICD-10-CM | POA: Diagnosis not present

## 2015-08-28 DIAGNOSIS — S83232D Complex tear of medial meniscus, current injury, left knee, subsequent encounter: Secondary | ICD-10-CM | POA: Diagnosis not present

## 2015-08-28 DIAGNOSIS — M25562 Pain in left knee: Secondary | ICD-10-CM | POA: Diagnosis not present

## 2015-08-28 DIAGNOSIS — M6281 Muscle weakness (generalized): Secondary | ICD-10-CM | POA: Diagnosis not present

## 2015-08-30 DIAGNOSIS — M6281 Muscle weakness (generalized): Secondary | ICD-10-CM | POA: Diagnosis not present

## 2015-08-30 DIAGNOSIS — R2689 Other abnormalities of gait and mobility: Secondary | ICD-10-CM | POA: Diagnosis not present

## 2015-08-30 DIAGNOSIS — S83232D Complex tear of medial meniscus, current injury, left knee, subsequent encounter: Secondary | ICD-10-CM | POA: Diagnosis not present

## 2015-08-30 DIAGNOSIS — M25562 Pain in left knee: Secondary | ICD-10-CM | POA: Diagnosis not present

## 2015-09-05 DIAGNOSIS — M6281 Muscle weakness (generalized): Secondary | ICD-10-CM | POA: Diagnosis not present

## 2015-09-05 DIAGNOSIS — M25562 Pain in left knee: Secondary | ICD-10-CM | POA: Diagnosis not present

## 2015-09-05 DIAGNOSIS — R2689 Other abnormalities of gait and mobility: Secondary | ICD-10-CM | POA: Diagnosis not present

## 2015-09-05 DIAGNOSIS — S83232D Complex tear of medial meniscus, current injury, left knee, subsequent encounter: Secondary | ICD-10-CM | POA: Diagnosis not present

## 2015-09-07 DIAGNOSIS — M6281 Muscle weakness (generalized): Secondary | ICD-10-CM | POA: Diagnosis not present

## 2015-09-07 DIAGNOSIS — R2689 Other abnormalities of gait and mobility: Secondary | ICD-10-CM | POA: Diagnosis not present

## 2015-09-07 DIAGNOSIS — S83232D Complex tear of medial meniscus, current injury, left knee, subsequent encounter: Secondary | ICD-10-CM | POA: Diagnosis not present

## 2015-09-07 DIAGNOSIS — M25562 Pain in left knee: Secondary | ICD-10-CM | POA: Diagnosis not present

## 2015-09-11 DIAGNOSIS — R2689 Other abnormalities of gait and mobility: Secondary | ICD-10-CM | POA: Diagnosis not present

## 2015-09-11 DIAGNOSIS — M6281 Muscle weakness (generalized): Secondary | ICD-10-CM | POA: Diagnosis not present

## 2015-09-11 DIAGNOSIS — S83232D Complex tear of medial meniscus, current injury, left knee, subsequent encounter: Secondary | ICD-10-CM | POA: Diagnosis not present

## 2015-09-11 DIAGNOSIS — M25562 Pain in left knee: Secondary | ICD-10-CM | POA: Diagnosis not present

## 2015-09-14 DIAGNOSIS — R2689 Other abnormalities of gait and mobility: Secondary | ICD-10-CM | POA: Diagnosis not present

## 2015-09-14 DIAGNOSIS — M25562 Pain in left knee: Secondary | ICD-10-CM | POA: Diagnosis not present

## 2015-09-14 DIAGNOSIS — S83232D Complex tear of medial meniscus, current injury, left knee, subsequent encounter: Secondary | ICD-10-CM | POA: Diagnosis not present

## 2015-09-14 DIAGNOSIS — M6281 Muscle weakness (generalized): Secondary | ICD-10-CM | POA: Diagnosis not present

## 2015-09-18 DIAGNOSIS — S83232D Complex tear of medial meniscus, current injury, left knee, subsequent encounter: Secondary | ICD-10-CM | POA: Diagnosis not present

## 2015-09-18 DIAGNOSIS — M6281 Muscle weakness (generalized): Secondary | ICD-10-CM | POA: Diagnosis not present

## 2015-09-18 DIAGNOSIS — R2689 Other abnormalities of gait and mobility: Secondary | ICD-10-CM | POA: Diagnosis not present

## 2015-09-18 DIAGNOSIS — M25562 Pain in left knee: Secondary | ICD-10-CM | POA: Diagnosis not present

## 2015-09-19 DIAGNOSIS — Z79899 Other long term (current) drug therapy: Secondary | ICD-10-CM | POA: Diagnosis not present

## 2015-09-19 DIAGNOSIS — F4312 Post-traumatic stress disorder, chronic: Secondary | ICD-10-CM | POA: Diagnosis not present

## 2015-09-19 DIAGNOSIS — E785 Hyperlipidemia, unspecified: Secondary | ICD-10-CM | POA: Diagnosis not present

## 2015-09-19 DIAGNOSIS — I10 Essential (primary) hypertension: Secondary | ICD-10-CM | POA: Diagnosis not present

## 2015-09-19 DIAGNOSIS — Z0000 Encounter for general adult medical examination without abnormal findings: Secondary | ICD-10-CM | POA: Diagnosis not present

## 2015-09-19 DIAGNOSIS — R739 Hyperglycemia, unspecified: Secondary | ICD-10-CM | POA: Diagnosis not present

## 2015-09-19 DIAGNOSIS — E039 Hypothyroidism, unspecified: Secondary | ICD-10-CM | POA: Diagnosis not present

## 2015-09-19 DIAGNOSIS — Z125 Encounter for screening for malignant neoplasm of prostate: Secondary | ICD-10-CM | POA: Diagnosis not present

## 2015-09-25 DIAGNOSIS — S83232D Complex tear of medial meniscus, current injury, left knee, subsequent encounter: Secondary | ICD-10-CM | POA: Diagnosis not present

## 2015-09-25 DIAGNOSIS — M25562 Pain in left knee: Secondary | ICD-10-CM | POA: Diagnosis not present

## 2015-09-25 DIAGNOSIS — R2689 Other abnormalities of gait and mobility: Secondary | ICD-10-CM | POA: Diagnosis not present

## 2015-09-25 DIAGNOSIS — M6281 Muscle weakness (generalized): Secondary | ICD-10-CM | POA: Diagnosis not present

## 2015-09-27 DIAGNOSIS — M25562 Pain in left knee: Secondary | ICD-10-CM | POA: Diagnosis not present

## 2015-09-27 DIAGNOSIS — R2689 Other abnormalities of gait and mobility: Secondary | ICD-10-CM | POA: Diagnosis not present

## 2015-09-27 DIAGNOSIS — S83232D Complex tear of medial meniscus, current injury, left knee, subsequent encounter: Secondary | ICD-10-CM | POA: Diagnosis not present

## 2015-09-27 DIAGNOSIS — M6281 Muscle weakness (generalized): Secondary | ICD-10-CM | POA: Diagnosis not present

## 2015-10-02 DIAGNOSIS — S83232D Complex tear of medial meniscus, current injury, left knee, subsequent encounter: Secondary | ICD-10-CM | POA: Diagnosis not present

## 2015-10-02 DIAGNOSIS — M6281 Muscle weakness (generalized): Secondary | ICD-10-CM | POA: Diagnosis not present

## 2015-10-02 DIAGNOSIS — R2689 Other abnormalities of gait and mobility: Secondary | ICD-10-CM | POA: Diagnosis not present

## 2015-10-02 DIAGNOSIS — M25562 Pain in left knee: Secondary | ICD-10-CM | POA: Diagnosis not present

## 2015-10-04 DIAGNOSIS — R2689 Other abnormalities of gait and mobility: Secondary | ICD-10-CM | POA: Diagnosis not present

## 2015-10-04 DIAGNOSIS — M6281 Muscle weakness (generalized): Secondary | ICD-10-CM | POA: Diagnosis not present

## 2015-10-04 DIAGNOSIS — S83232D Complex tear of medial meniscus, current injury, left knee, subsequent encounter: Secondary | ICD-10-CM | POA: Diagnosis not present

## 2015-10-04 DIAGNOSIS — M25562 Pain in left knee: Secondary | ICD-10-CM | POA: Diagnosis not present

## 2015-10-09 DIAGNOSIS — S83232D Complex tear of medial meniscus, current injury, left knee, subsequent encounter: Secondary | ICD-10-CM | POA: Diagnosis not present

## 2015-10-09 DIAGNOSIS — M6281 Muscle weakness (generalized): Secondary | ICD-10-CM | POA: Diagnosis not present

## 2015-10-09 DIAGNOSIS — R2689 Other abnormalities of gait and mobility: Secondary | ICD-10-CM | POA: Diagnosis not present

## 2015-10-09 DIAGNOSIS — M25562 Pain in left knee: Secondary | ICD-10-CM | POA: Diagnosis not present

## 2015-10-11 DIAGNOSIS — S83232D Complex tear of medial meniscus, current injury, left knee, subsequent encounter: Secondary | ICD-10-CM | POA: Diagnosis not present

## 2015-10-11 DIAGNOSIS — R2689 Other abnormalities of gait and mobility: Secondary | ICD-10-CM | POA: Diagnosis not present

## 2015-10-11 DIAGNOSIS — M25562 Pain in left knee: Secondary | ICD-10-CM | POA: Diagnosis not present

## 2015-10-11 DIAGNOSIS — M6281 Muscle weakness (generalized): Secondary | ICD-10-CM | POA: Diagnosis not present

## 2015-12-25 ENCOUNTER — Other Ambulatory Visit: Payer: Self-pay

## 2016-02-07 DIAGNOSIS — Z23 Encounter for immunization: Secondary | ICD-10-CM | POA: Diagnosis not present

## 2016-04-02 DIAGNOSIS — L820 Inflamed seborrheic keratosis: Secondary | ICD-10-CM | POA: Diagnosis not present

## 2016-04-02 DIAGNOSIS — D225 Melanocytic nevi of trunk: Secondary | ICD-10-CM | POA: Diagnosis not present

## 2016-04-02 DIAGNOSIS — L821 Other seborrheic keratosis: Secondary | ICD-10-CM | POA: Diagnosis not present

## 2016-04-02 DIAGNOSIS — C44729 Squamous cell carcinoma of skin of left lower limb, including hip: Secondary | ICD-10-CM | POA: Diagnosis not present

## 2016-04-02 DIAGNOSIS — L578 Other skin changes due to chronic exposure to nonionizing radiation: Secondary | ICD-10-CM | POA: Diagnosis not present

## 2016-10-10 DIAGNOSIS — I10 Essential (primary) hypertension: Secondary | ICD-10-CM | POA: Diagnosis not present

## 2016-10-10 DIAGNOSIS — I2510 Atherosclerotic heart disease of native coronary artery without angina pectoris: Secondary | ICD-10-CM | POA: Diagnosis not present

## 2016-10-10 DIAGNOSIS — Z79899 Other long term (current) drug therapy: Secondary | ICD-10-CM | POA: Diagnosis not present

## 2016-10-10 DIAGNOSIS — E039 Hypothyroidism, unspecified: Secondary | ICD-10-CM | POA: Diagnosis not present

## 2016-10-10 DIAGNOSIS — E785 Hyperlipidemia, unspecified: Secondary | ICD-10-CM | POA: Diagnosis not present

## 2016-10-10 DIAGNOSIS — Z0000 Encounter for general adult medical examination without abnormal findings: Secondary | ICD-10-CM | POA: Diagnosis not present

## 2016-10-10 DIAGNOSIS — Z125 Encounter for screening for malignant neoplasm of prostate: Secondary | ICD-10-CM | POA: Diagnosis not present

## 2016-10-10 DIAGNOSIS — R739 Hyperglycemia, unspecified: Secondary | ICD-10-CM | POA: Diagnosis not present

## 2016-10-10 DIAGNOSIS — K219 Gastro-esophageal reflux disease without esophagitis: Secondary | ICD-10-CM | POA: Diagnosis not present

## 2017-02-03 DIAGNOSIS — Z23 Encounter for immunization: Secondary | ICD-10-CM | POA: Diagnosis not present

## 2017-04-01 DIAGNOSIS — L821 Other seborrheic keratosis: Secondary | ICD-10-CM | POA: Diagnosis not present

## 2017-04-01 DIAGNOSIS — L578 Other skin changes due to chronic exposure to nonionizing radiation: Secondary | ICD-10-CM | POA: Diagnosis not present

## 2017-04-01 DIAGNOSIS — L820 Inflamed seborrheic keratosis: Secondary | ICD-10-CM | POA: Diagnosis not present

## 2017-12-02 DIAGNOSIS — E039 Hypothyroidism, unspecified: Secondary | ICD-10-CM | POA: Diagnosis not present

## 2017-12-02 DIAGNOSIS — R739 Hyperglycemia, unspecified: Secondary | ICD-10-CM | POA: Diagnosis not present

## 2017-12-02 DIAGNOSIS — Z0000 Encounter for general adult medical examination without abnormal findings: Secondary | ICD-10-CM | POA: Diagnosis not present

## 2017-12-02 DIAGNOSIS — I10 Essential (primary) hypertension: Secondary | ICD-10-CM | POA: Diagnosis not present

## 2017-12-02 DIAGNOSIS — Z125 Encounter for screening for malignant neoplasm of prostate: Secondary | ICD-10-CM | POA: Diagnosis not present

## 2017-12-02 DIAGNOSIS — I2510 Atherosclerotic heart disease of native coronary artery without angina pectoris: Secondary | ICD-10-CM | POA: Diagnosis not present

## 2017-12-02 DIAGNOSIS — Z79899 Other long term (current) drug therapy: Secondary | ICD-10-CM | POA: Diagnosis not present

## 2017-12-02 DIAGNOSIS — K219 Gastro-esophageal reflux disease without esophagitis: Secondary | ICD-10-CM | POA: Diagnosis not present

## 2017-12-02 DIAGNOSIS — E785 Hyperlipidemia, unspecified: Secondary | ICD-10-CM | POA: Diagnosis not present

## 2018-02-11 DIAGNOSIS — Z23 Encounter for immunization: Secondary | ICD-10-CM | POA: Diagnosis not present

## 2018-03-12 ENCOUNTER — Other Ambulatory Visit: Payer: Self-pay

## 2018-04-07 DIAGNOSIS — L821 Other seborrheic keratosis: Secondary | ICD-10-CM | POA: Diagnosis not present

## 2018-04-07 DIAGNOSIS — L570 Actinic keratosis: Secondary | ICD-10-CM | POA: Diagnosis not present

## 2018-04-07 DIAGNOSIS — L578 Other skin changes due to chronic exposure to nonionizing radiation: Secondary | ICD-10-CM | POA: Diagnosis not present

## 2018-07-21 ENCOUNTER — Other Ambulatory Visit: Payer: Self-pay

## 2018-07-26 ENCOUNTER — Telehealth: Payer: Self-pay

## 2018-07-26 NOTE — Telephone Encounter (Signed)
Pt has been rescheduled due to COVID-19. 3 months per Dr. Bettina Gavia. Pt verbalized understanding.

## 2018-07-28 ENCOUNTER — Ambulatory Visit: Payer: Self-pay | Admitting: Cardiology

## 2018-10-18 NOTE — Progress Notes (Deleted)
Cardiology Office Note:    Date:  10/18/2018   ID:  Jason Bauer, DOB 02/24/1946, MRN [id number]  PCP:  No primary care provider on file.  Cardiologist:  Shirlee More, MD    Referring MD: No ref. provider found    ASSESSMENT:    No diagnosis found. PLAN:    In order of problems listed above:  1. ***   Next appointment: ***   Medication Adjustments/Labs and Tests Ordered: Current medicines are reviewed at length with the patient today.  Concerns regarding medicines are outlined above.  No orders of the defined types were placed in this encounter.  No orders of the defined types were placed in this encounter.   No chief complaint on file.   History of Present Illness:    Jason Bauer is a 73 y.o. male with a hx of CAD last seen 08/02/2015 at [facility] cardiology.  He has a history remote PCI in 2003 as well as hypertension hyperlipidemia. Compliance with diet, lifestyle and medications: *** Past Medical History:  Diagnosis Date  . Anxiety   . Diverticulosis   . GERD (gastroesophageal reflux disease)   . Hiatal hernia   . Hypothyroidism   . PTSD (post-traumatic stress disorder)     *** The histories are not reviewed yet. Please review them in the "History" navigator section and refresh this [place].  Current Medications: No outpatient medications have been marked as taking for the 10/19/18 encounter (Appointment) with Richardo Priest, MD.     Allergies:   Patient has no allergy information on record.   Social History   Socioeconomic History  . Marital status: Married    Spouse name: Not on file  . Number of children: Not on file  . Years of education: Not on file  . Highest education level: Not on file  Occupational History  . Not on file  Social Needs  . Financial resource strain: Not on file  . Food insecurity    Worry: Not on file    Inability: Not on file  . Transportation needs    Medical: Not on file    Non-medical: Not on file   Tobacco Use  . Smoking status: Never Smoker  . Smokeless tobacco: Never Used  Substance and Sexual Activity  . Alcohol use: Never    Frequency: Never  . Drug use: Never  . Sexual activity: Not on file  Lifestyle  . Physical activity    Days per week: Not on file    Minutes per session: Not on file  . Stress: Not on file  Relationships  . Social Herbalist on phone: Not on file    Gets together: Not on file    Attends religious service: Not on file    Active member of club or organization: Not on file    Attends meetings of clubs or organizations: Not on file    Relationship status: Not on file  Other Topics Concern  . Not on file  Social History Narrative  . Not on file     Family History: The patient's ***family history includes CAD in his father; Cancer in his father; Diabetes in his father; Hypertension in his father. ROS:   Please see the history of present illness.    All other systems reviewed and are negative.  EKGs/Labs/Other Studies Reviewed:    The following studies were reviewed today:  EKG:  EKG ordered today and personally reviewed.  The ekg ordered today demonstrates ***  Recent Labs: No results found for requested labs within last 8760 hours.  Recent Lipid Panel No results found for: CHOL, TRIG, HDL, CHOLHDL, VLDL, LDLCALC, LDLDIRECT  Physical Exam:    VS:  There were no vitals taken for this visit.    Wt Readings from Last 3 Encounters:  No data found for Wt     GEN: *** Well nourished, well developed in no acute distress HEENT: Normal NECK: No JVD; No carotid bruits LYMPHATICS: No lymphadenopathy CARDIAC: ***RRR, no murmurs, rubs, gallops RESPIRATORY:  Clear to auscultation without rales, wheezing or rhonchi  ABDOMEN: Soft, non-tender, non-distended MUSCULOSKELETAL:  No edema; No deformity  SKIN: Warm and dry NEUROLOGIC:  Alert and oriented x 3 PSYCHIATRIC:  Normal affect    Signed, Shirlee More, MD  10/18/2018 8:03 AM     Jason Bauer

## 2018-10-19 ENCOUNTER — Telehealth: Payer: Self-pay | Admitting: Cardiology

## 2018-10-25 DIAGNOSIS — K219 Gastro-esophageal reflux disease without esophagitis: Secondary | ICD-10-CM | POA: Diagnosis not present

## 2018-10-25 DIAGNOSIS — R739 Hyperglycemia, unspecified: Secondary | ICD-10-CM | POA: Diagnosis not present

## 2018-10-25 DIAGNOSIS — Z125 Encounter for screening for malignant neoplasm of prostate: Secondary | ICD-10-CM | POA: Diagnosis not present

## 2018-10-25 DIAGNOSIS — H8110 Benign paroxysmal vertigo, unspecified ear: Secondary | ICD-10-CM | POA: Diagnosis not present

## 2018-10-25 DIAGNOSIS — E785 Hyperlipidemia, unspecified: Secondary | ICD-10-CM | POA: Diagnosis not present

## 2018-10-25 DIAGNOSIS — I10 Essential (primary) hypertension: Secondary | ICD-10-CM | POA: Diagnosis not present

## 2018-10-25 DIAGNOSIS — Z79899 Other long term (current) drug therapy: Secondary | ICD-10-CM | POA: Diagnosis not present

## 2018-10-26 ENCOUNTER — Ambulatory Visit: Payer: Self-pay | Admitting: Cardiology

## 2018-11-02 NOTE — Progress Notes (Signed)
Cardiology Office Note:    Date:  11/03/2018   ID:  Jason Bauer, DOB 05/04/1945, MRN [id number]  PCP:  Street, Sharon Mt, MD  Cardiologist:  Shirlee More, MD    Referring MD: No ref. provider found    ASSESSMENT:    1. Coronary artery disease of native artery of native heart with stable angina pectoris ([place])   2. Essential hypertension   3. Mixed hyperlipidemia    PLAN:    In order of problems listed above:  1. Coronary artery disease stable New York Heart Association class I continue medical therapy including long-term dual antiplatelet and lipid-lowering treatment.  At this time I do not think he requires an ischemia evaluation 2. Hypertension poorly controlled I think he is developed tolerance to his ACE inhibitor at low-dose thiazide diuretic continue to check daily at home recheck office 6 weeks and recheck renal function potassium at that time 3. Dyslipidemia stable continue his high intensity statin with CAD   Next appointment: 6 weeks BP check office visit   Medication Adjustments/Labs and Tests Ordered: Current medicines are reviewed at length with the patient today.  Concerns regarding medicines are outlined above.  Orders Placed This Encounter  Procedures  . EKG 12-Lead   No orders of the defined types were placed in this encounter.   Chief Complaint  Patient presents with  . Follow-up  . Coronary Artery Disease    History of Present Illness:    Jason Bauer is a 73 y.o. male with a hx of CAD PCI and stent left anterior descending artery in 2003 hypertension and hyper lipidemia.  Last seen 08/21/2025 at [facility]ology.  Compliance with diet, lifestyle and medications: Yes  Overall he is done well walks the dog probably 30 to 40 minutes/day no angina dyspnea palpitation or syncope tolerates his cardiac medications he noticed before physicians visit that his systolics were up in the range of 1 50-1 60 in the doctor's office up to 381  systolic.  Since he doubled his lisinopril his BP is at target.  We discussed options for treatment he asked about a diuretic, we will do his transition from lisinopril to lisinopril hydrochlorothiazide and see back in the office 6 weeks and recheck renal function.  He handcarried labs to my office performed recently 10/25/2018 CBC was normal A1c 6.1% GFR greater than 60 cc potassium normal cholesterol 175 HDL 57 LDL 92.  Past Medical History:  Diagnosis Date  . Anxiety   . Carcinoma of oropharynx ([place]) 07/30/2011  . Coronary artery disease involving native coronary artery of native heart 08/01/2015   PCI and stent LAD 2003  . Diverticulosis   . Essential hypertension 08/01/2015  . GERD (gastroesophageal reflux disease)   . Hiatal hernia   . Hyperlipidemia 08/01/2015  . Hypothyroidism   . Preoperative cardiovascular examination 08/01/2015  . PTSD (post-traumatic stress disorder)     Past Surgical History:  Procedure Laterality Date  . ANAL FISTULOTOMY    . CARDIAC CATHETERIZATION    . CORONARY ANGIOPLASTY    . HEMORRHOID SURGERY    . TONSILLECTOMY      Current Medications: Current Meds  Medication Sig  . ALPRAZolam (XANAX) 0.5 MG tablet Take 0.25 mg by mouth 2 (two) times daily.   Marland Kitchen aspirin EC 81 MG tablet Take 81 mg by mouth daily.  Marland Kitchen atorvastatin (LIPITOR) 10 MG tablet Take 10 mg by mouth daily.  . carvedilol (COREG) 6.25 MG tablet Take 6.25 mg by mouth 2 (  two) times daily.  . citalopram (CELEXA) 20 MG tablet Take 10 mg by mouth 2 (two) times a day.   . clopidogrel (PLAVIX) 75 MG tablet Take 75 mg by mouth daily.  . Coenzyme Q10 (CO Q 10) 100 MG CAPS Take 1 capsule by mouth daily.  Marland Kitchen levothyroxine (SYNTHROID) 112 MCG tablet Take 100 mcg by mouth daily.   Marland Kitchen lisinopril (PRINIVIL,ZESTRIL) 20 MG tablet Take 20 mg by mouth 2 (two) times a day.   . Magnesium Oxide 420 MG TABS Take 2 tablets by mouth daily.  . Multiple Vitamin (MULTIVITAMIN) capsule Take 1 capsule by mouth daily.  .  pantoprazole (PROTONIX) 40 MG tablet Take 40 mg by mouth daily as needed.     Allergies:   Patient has no known allergies.   Social History   Socioeconomic History  . Marital status: Married    Spouse name: Not on file  . Number of children: Not on file  . Years of education: Not on file  . Highest education level: Not on file  Occupational History  . Not on file  Social Needs  . Financial resource strain: Not on file  . Food insecurity    Worry: Not on file    Inability: Not on file  . Transportation needs    Medical: Not on file    Non-medical: Not on file  Tobacco Use  . Smoking status: Never Smoker  . Smokeless tobacco: Never Used  Substance and Sexual Activity  . Alcohol use: Never    Frequency: Never  . Drug use: Never  . Sexual activity: Not on file  Lifestyle  . Physical activity    Days per week: Not on file    Minutes per session: Not on file  . Stress: Not on file  Relationships  . Social Herbalist on phone: Not on file    Gets together: Not on file    Attends religious service: Not on file    Active member of club or organization: Not on file    Attends meetings of clubs or organizations: Not on file    Relationship status: Not on file  Other Topics Concern  . Not on file  Social History Narrative  . Not on file     Family History: The patient's family history includes Bladder Cancer in his father; CAD in his father; Cancer in his father; Diabetes in his paternal grandfather; Hypertension in his father; Scoliosis in his mother. ROS:   Please see the history of present illness.    All other systems reviewed and are negative.  EKGs/Labs/Other Studies Reviewed:    The following studies were reviewed today:  EKG:  EKG ordered today and personally reviewed.  The ekg ordered today demonstrates sinus bradycardia right bundle branch block  Recent Labs: No results found for requested labs within last 8760 hours.  Recent Lipid Panel No  results found for: CHOL, TRIG, HDL, CHOLHDL, VLDL, LDLCALC, LDLDIRECT  Physical Exam:    VS:  BP (!) 180/78 (BP Location: Left Arm, Patient Position: Sitting, Cuff Size: Normal)   Pulse (!) 49   Ht 5\' 10"  (1.778 m)   Wt 186 lb 9.6 oz (84.6 kg)   SpO2 98%   BMI 26.77 kg/m     Wt Readings from Last 3 Encounters:  11/03/18 186 lb 9.6 oz (84.6 kg)     GEN:  Well nourished, well developed in no acute distress HEENT: Normal NECK: No JVD; No carotid bruits  LYMPHATICS: No lymphadenopathy CARDIAC: RRR, no murmurs, rubs, gallops RESPIRATORY:  Clear to auscultation without rales, wheezing or rhonchi  ABDOMEN: Soft, non-tender, non-distended MUSCULOSKELETAL:  No edema; No deformity  SKIN: Warm and dry NEUROLOGIC:  Alert and oriented x 3 PSYCHIATRIC:  Normal affect    Signed, Shirlee More, MD  11/03/2018 8:43 AM    [place]

## 2018-11-03 ENCOUNTER — Other Ambulatory Visit: Payer: Self-pay

## 2018-11-03 ENCOUNTER — Ambulatory Visit: Payer: Self-pay | Admitting: Cardiology

## 2018-11-03 ENCOUNTER — Ambulatory Visit (INDEPENDENT_AMBULATORY_CARE_PROVIDER_SITE_OTHER): Payer: Medicare Other | Admitting: Cardiology

## 2018-11-03 ENCOUNTER — Encounter: Payer: Self-pay | Admitting: Cardiology

## 2018-11-03 VITALS — BP 180/78 | HR 49 | Ht 70.0 in | Wt 186.6 lb

## 2018-11-03 DIAGNOSIS — I10 Essential (primary) hypertension: Secondary | ICD-10-CM | POA: Diagnosis not present

## 2018-11-03 DIAGNOSIS — I25118 Atherosclerotic heart disease of native coronary artery with other forms of angina pectoris: Secondary | ICD-10-CM | POA: Diagnosis not present

## 2018-11-03 DIAGNOSIS — E782 Mixed hyperlipidemia: Secondary | ICD-10-CM

## 2018-11-03 MED ORDER — LISINOPRIL-HYDROCHLOROTHIAZIDE 20-12.5 MG PO TABS: 1.0000 | ORAL_TABLET | Freq: Two times a day (BID) | ORAL | 3 refills | Status: DC

## 2018-11-03 NOTE — Patient Instructions (Addendum)
Medication Instructions:  Your physician has recommended you make the following change in your medication:   STOP lisinopril  START lisinopril-hydrochlorothiazide (zestoretic) 20-12.5 mg: Take 1 tablet twice daily    If you need a refill on your cardiac medications before your next appointment, please call your pharmacy.   Lab work: None  If you have labs (blood work) drawn today and your tests are completely normal, you will receive your results only by: Jason Bauer MyChart Message (if you have MyChart) OR . A paper copy in the mail If you have any lab test that is abnormal or we need to change your treatment, we will call you to review the results.  Testing/Procedures: You had an EKG today.   Follow-Up: At [facility], you and your health needs are our priority.  As part of our continuing mission to provide you with exceptional heart care, we have created designated Provider Care Teams.  These Care Teams include your primary Cardiologist (physician) and Advanced Practice Providers (APPs -  Physician Assistants and Nurse Practitioners) who all work together to provide you with the care you need, when you need it. You will need a follow up appointment in 6 weeks.     Hydrochlorothiazide, HCTZ; Lisinopril tablets What is this medicine? HYDROCHLOROTHIAZIDE; LISINOPRIL (hye droe klor oh THYE a zide; lyse IN oh pril) is a combination of a diuretic and an ACE inhibitor. It is used to treat high blood pressure. This medicine may be used for other purposes; ask your health care provider or pharmacist if you have questions. COMMON BRAND NAME(S): Prinzide, Zestoretic What should I tell my health care provider before I take this medicine? They need to know if you have any of these conditions:  bone marrow disease  decreased urine  diabetes  heart or blood vessel disease  if you are on a special diet like a low salt diet  immune system problems, like lupus  kidney disease  liver  disease  previous swelling of the tongue, face, or lips with difficulty breathing, difficulty swallowing, hoarseness, or tightening of the throat  recent heart attack or stroke  an unusual or allergic reaction to lisinopril, hydrochlorothiazide, sulfa drugs, other medicines, insect venom, foods, dyes, or preservatives  pregnant or trying to get pregnant  breast-feeding How should I use this medicine? Take this medicine by mouth with a glass of water. Follow the directions on the prescription label. You can take it with or without food. If it upsets your stomach, take it with food. Take your medicine at regular intervals. Do not take it more often than directed. Do not stop taking except on your doctor's advice. Talk to your pediatrician regarding the use of this medicine in children. Special care may be needed. Overdosage: If you think you have taken too much of this medicine contact a poison control center or emergency room at once. NOTE: This medicine is only for you. Do not share this medicine with others. What if I miss a dose? If you miss a dose, take it as soon as you can. If it is almost time for your next dose, take only that dose. Do not take double or extra doses. What may interact with this medicine? Do not take this medication with any of the following medications:  sacubitril; valsartan This medicine may also interact with the following:  barbiturates like phenobarbital  blood pressure medicines  corticosteroids like prednisone  diabetic medications  diuretics, especially triamterene, spironolactone or amiloride  lithium  NSAIDs, medicines  for pain and inflammation, like ibuprofen or naproxen  potassium salts or potassium supplements  prescription pain medicines  skeletal muscle relaxants like tubocurarine  some cholesterol lowering medications like cholestyramine or colestipol This list may not describe all possible interactions. Give your health care  provider a list of all the medicines, herbs, non-prescription drugs, or dietary supplements you use. Also tell them if you smoke, drink alcohol, or use illegal drugs. Some items may interact with your medicine. What should I watch for while using this medicine? Visit your doctor or health care professional for regular checks on your progress. Check your blood pressure as directed. Ask your doctor or health care professional what your blood pressure should be and when you should contact him or her. Call your doctor or health care professional if you notice an irregular or fast heart beat. You must not get dehydrated. Ask your doctor or health care professional how much fluid you need to drink a day. Check with him or her if you get an attack of severe diarrhea, nausea and vomiting, or if you sweat a lot. The loss of too much body fluid can make it dangerous for you to take this medicine. Women should inform their doctor if they wish to become pregnant or think they might be pregnant. There is a potential for serious side effects to an unborn child. Talk to your health care professional or pharmacist for more information. You may get drowsy or dizzy. Do not drive, use machinery, or do anything that needs mental alertness until you know how this drug affects you. Do not stand or sit up quickly, especially if you are an older patient. This reduces the risk of dizzy or fainting spells. Alcohol can make you more drowsy and dizzy. Avoid alcoholic drinks. This medicine may increase blood sugar. Ask your healthcare provider if changes in diet or medicines are needed if you have diabetes. Avoid salt substitutes unless you are told otherwise by your doctor or health care professional. This medicine can make you more sensitive to the sun. Keep out of the sun. If you cannot avoid being in the sun, wear protective clothing and use sunscreen. Do not use sun lamps or tanning beds/booths. Do not treat yourself for coughs,  colds, or pain while you are taking this medicine without asking your doctor or health care professional for advice. Some ingredients may increase your blood pressure. What side effects may I notice from receiving this medicine? Side effects that you should report to your doctor or health care professional as soon as possible:  changes in vision  confusion, dizziness, light headedness or fainting spells  decreased amount of urine passed  difficulty breathing or swallowing, hoarseness, or tightening of the throat  eye pain  fast or irregular heart beat, palpitations, or chest pain  muscle cramps  nausea and vomiting  persistent dry cough  redness, blistering, peeling or loosening of the skin, including inside the mouth   signs and symptoms of high blood sugar such as being more thirsty or hungry or having to urinate more than normal. You may also feel very tired or have blurry vision.  stomach pain  swelling of your face, lips, tongue, hands, or feet  unusual rash, bleeding or bruising, or pinpoint red spots on the skin  worsened gout pain  yellowing of the eyes or skin Side effects that usually do not require medical attention (report to your doctor or health care professional if they continue or are bothersome):  change  in sex drive or performance  cough  headache This list may not describe all possible side effects. Call your doctor for medical advice about side effects. You may report side effects to FDA at 1-800-FDA-1088. Where should I keep my medicine? Keep out of the reach of children. Store at room temperature between 20 and 25 degrees C (68 and 77 degrees F). Protect from moisture and excessive light. Keep container tightly closed. Throw away any unused medicine after the expiration date. NOTE: This sheet is a summary. It may not cover all possible information. If you have questions about this medicine, talk to your doctor, pharmacist, or health care  provider.  2020 [employer]vier/Gold Standard (2018-02-03 09:35:31)

## 2018-12-21 NOTE — Progress Notes (Signed)
Cardiology Office Note:    Date:  12/22/2018   ID:  Jason Bauer, Jason Bauer 1945/06/25, MRN [id number]  PCP:  Street, Sharon Mt, MD  Cardiologist:  Shirlee More, MD    Referring MD: [address], [place] Mt, *    ASSESSMENT:    1. Essential hypertension    PLAN:    In order of problems listed above:  1. His blood pressures are consistently in range 120 130/70-80 we will continue his previous lisinopril without a diuretic and if he gets systolics greater than 0000000 in the morning with an extra dose of beta-blocker midday.  He has great variability in his blood pressure other issues of PTSD today is a stressful day with closing on a new home and we have chosen to believe his home blood pressures and use them to guide therapy.  I think he is at good clinical target his CAD and dyslipidemia are stable.  Recent lab work shows a cholesterol of 175 LDL 92 HDL 57 and a normal creatinine.  I will plan to see him in 6 months.   Next appointment: 6 months   Medication Adjustments/Labs and Tests Ordered: Current medicines are reviewed at length with the patient today.  Concerns regarding medicines are outlined above.  No orders of the defined types were placed in this encounter.  No orders of the defined types were placed in this encounter.   Chief Complaint  Patient presents with  . Hypertension    History of Present Illness:    Jason Bauer is a 73 y.o. male with a hx of  CAD PCI and stent left anterior descending artery in 2003 hypertension and hyper lipidemia  last seen 11/03/2018 with uncontrolled hypertension. Compliance with diet, lifestyle and medications: Yes  Unfortunately with adding a thiazide diuretic he is getting diastolic BP in the range of 60 and felt lightheaded and started having vertigo.  He discontinued went back to his previous lisinopril and since then home blood pressures are at target.  There is a stressful day closing at home and issues with PTSD.  No  angina dyspnea syncope. Past Medical History:  Diagnosis Date  . Anxiety   . Carcinoma of oropharynx ([place]) 07/30/2011  . Coronary artery disease involving native coronary artery of native heart 08/01/2015   PCI and stent LAD 2003  . Diverticulosis   . Essential hypertension 08/01/2015  . GERD (gastroesophageal reflux disease)   . Hiatal hernia   . Hyperlipidemia 08/01/2015  . Hypothyroidism   . Preoperative cardiovascular examination 08/01/2015  . PTSD (post-traumatic stress disorder)     Past Surgical History:  Procedure Laterality Date  . ANAL FISTULOTOMY    . CARDIAC CATHETERIZATION    . CORONARY ANGIOPLASTY    . HEMORRHOID SURGERY    . TONSILLECTOMY      Current Medications: Current Meds  Medication Sig  . ALPRAZolam (XANAX) 0.5 MG tablet Take 0.5 mg by mouth 2 (two) times daily.   Marland Kitchen aspirin EC 81 MG tablet Take 81 mg by mouth daily.  Marland Kitchen atorvastatin (LIPITOR) 10 MG tablet Take 10 mg by mouth daily.  . carvedilol (COREG) 6.25 MG tablet Take 6.25 mg by mouth 2 (two) times daily.  . citalopram (CELEXA) 20 MG tablet Take 10 mg by mouth 2 (two) times a day.   . clopidogrel (PLAVIX) 75 MG tablet Take 75 mg by mouth daily.  . Coenzyme Q10 (CO Q 10) 100 MG CAPS Take 1 capsule by mouth daily.  Marland Kitchen levothyroxine (SYNTHROID)  112 MCG tablet Take 112 mcg by mouth daily.   Marland Kitchen lisinopril-hydrochlorothiazide (ZESTORETIC) 20-12.5 MG tablet Take 1 tablet by mouth 2 (two) times a day.  . Magnesium Oxide 420 MG TABS Take 2 tablets by mouth daily.  . Multiple Vitamin (MULTIVITAMIN) capsule Take 1 capsule by mouth daily.  . pantoprazole (PROTONIX) 40 MG tablet Take 40 mg by mouth daily.      Allergies:   Patient has no known allergies.   Social History   Socioeconomic History  . Marital status: Married    Spouse name: Not on file  . Number of children: Not on file  . Years of education: Not on file  . Highest education level: Not on file  Occupational History  . Not on file  Social Needs  .  Financial resource strain: Not on file  . Food insecurity    Worry: Not on file    Inability: Not on file  . Transportation needs    Medical: Not on file    Non-medical: Not on file  Tobacco Use  . Smoking status: Never Smoker  . Smokeless tobacco: Never Used  Substance and Sexual Activity  . Alcohol use: Never    Frequency: Never  . Drug use: Never  . Sexual activity: Not on file  Lifestyle  . Physical activity    Days per week: Not on file    Minutes per session: Not on file  . Stress: Not on file  Relationships  . Social Herbalist on phone: Not on file    Gets together: Not on file    Attends religious service: Not on file    Active member of club or organization: Not on file    Attends meetings of clubs or organizations: Not on file    Relationship status: Not on file  Other Topics Concern  . Not on file  Social History Narrative  . Not on file     Family History: The patient's family history includes Bladder Cancer in his father; CAD in his father; Cancer in his father; Diabetes in his paternal grandfather; Hypertension in his father; Scoliosis in his mother. ROS:   Please see the history of present illness.    All other systems reviewed and are negative.  EKGs/Labs/Other Studies Reviewed:    The following studies were reviewed today:   Physical Exam:    VS:  BP (!) 174/86 (BP Location: Right Arm, Patient Position: Sitting, Cuff Size: Normal)   Pulse (!) 54   Ht 5\' 10"  (1.778 m)   Wt 188 lb 9.6 oz (85.5 kg)   SpO2 98%   BMI 27.06 kg/m     Wt Readings from Last 3 Encounters:  12/22/18 188 lb 9.6 oz (85.5 kg)  11/03/18 186 lb 9.6 oz (84.6 kg)     GEN:  Well nourished, well developed in no acute distress HEENT: Normal NECK: No JVD; No carotid bruits LYMPHATICS: No lymphadenopathy CARDIAC: RRR, no murmurs, rubs, gallops RESPIRATORY:  Clear to auscultation without rales, wheezing or rhonchi  ABDOMEN: Soft, non-tender, non-distended  MUSCULOSKELETAL:  No edema; No deformity  SKIN: Warm and dry NEUROLOGIC:  Alert and oriented x 3 PSYCHIATRIC:  Normal affect    Signed, Shirlee More, MD  12/22/2018 2:55 PM     [facility]

## 2018-12-22 ENCOUNTER — Encounter: Payer: Self-pay | Admitting: Cardiology

## 2018-12-22 ENCOUNTER — Other Ambulatory Visit: Payer: Self-pay

## 2018-12-22 ENCOUNTER — Ambulatory Visit (INDEPENDENT_AMBULATORY_CARE_PROVIDER_SITE_OTHER): Payer: Medicare Other | Admitting: Cardiology

## 2018-12-22 VITALS — BP 174/86 | HR 54 | Ht 70.0 in | Wt 188.6 lb

## 2018-12-22 DIAGNOSIS — I10 Essential (primary) hypertension: Secondary | ICD-10-CM | POA: Diagnosis not present

## 2018-12-22 MED ORDER — CARVEDILOL 6.25 MG PO TABS: 6.2500 mg | ORAL_TABLET | Freq: Two times a day (BID) | ORAL | 1 refills | Status: DC

## 2018-12-22 NOTE — Patient Instructions (Signed)
Medication Instructions:  Your physician has recommended you make the following change in your medication:  INCREASE carvedilol (coreg) 6.25 mg: Take 1 tablet twice daily. Take 1 extra tablet if your systolic BP (top number) is greater than 160 mid-day.   If you need a refill on your cardiac medications before your next appointment, please call your pharmacy.   Lab work: None  If you have labs (blood work) drawn today and your tests are completely normal, you will receive your results only by: Marland Kitchen MyChart Message (if you have MyChart) OR . A paper copy in the mail If you have any lab test that is abnormal or we need to change your treatment, we will call you to review the results.  Testing/Procedures: None  Follow-Up: At [facility], you and your health needs are our priority.  As part of our continuing mission to provide you with exceptional heart care, we have created designated Provider Care Teams.  These Care Teams include your primary Cardiologist (physician) and Advanced Practice Providers (APPs -  Physician Assistants and Nurse Practitioners) who all work together to provide you with the care you need, when you need it. You will need a follow up appointment in 6 months.  Please call our office 2 months in advance to schedule this appointment.

## 2019-02-10 DIAGNOSIS — Z23 Encounter for immunization: Secondary | ICD-10-CM | POA: Diagnosis not present

## 2019-05-03 DIAGNOSIS — R519 Headache, unspecified: Secondary | ICD-10-CM | POA: Diagnosis not present

## 2019-05-03 DIAGNOSIS — Z20828 Contact with and (suspected) exposure to other viral communicable diseases: Secondary | ICD-10-CM | POA: Diagnosis not present

## 2019-05-03 DIAGNOSIS — J0100 Acute maxillary sinusitis, unspecified: Secondary | ICD-10-CM | POA: Diagnosis not present

## 2019-06-14 ENCOUNTER — Telehealth: Payer: Self-pay | Admitting: Cardiology

## 2019-06-14 NOTE — Telephone Encounter (Signed)
New Message    Pt wants to know how to get his old records from Dr Bettina Gavia when he was at [facility].i

## 2019-06-14 NOTE — Telephone Encounter (Signed)
Called patient. Informed him he will have to reach out to [facility] medical records himself.

## 2019-06-14 NOTE — Telephone Encounter (Signed)
Message routed to [facility]

## 2019-06-23 ENCOUNTER — Other Ambulatory Visit: Payer: Self-pay

## 2019-06-23 ENCOUNTER — Encounter: Payer: Self-pay | Admitting: Cardiology

## 2019-06-23 ENCOUNTER — Ambulatory Visit (INDEPENDENT_AMBULATORY_CARE_PROVIDER_SITE_OTHER): Payer: Medicare Other | Admitting: Cardiology

## 2019-06-23 VITALS — BP 138/78 | HR 54 | Temp 97.7°F | Ht 70.0 in | Wt 190.0 lb

## 2019-06-23 DIAGNOSIS — E782 Mixed hyperlipidemia: Secondary | ICD-10-CM | POA: Diagnosis not present

## 2019-06-23 DIAGNOSIS — I25118 Atherosclerotic heart disease of native coronary artery with other forms of angina pectoris: Secondary | ICD-10-CM | POA: Diagnosis not present

## 2019-06-23 DIAGNOSIS — I10 Essential (primary) hypertension: Secondary | ICD-10-CM

## 2019-06-23 LAB — COMPREHENSIVE METABOLIC PANEL
ALT: 19 IU/L (ref 0–44)
AST: 23 IU/L (ref 0–40)
Albumin/Globulin Ratio: 2 (ref 1.2–2.2)
Albumin: 4.8 g/dL — ABNORMAL HIGH (ref 3.7–4.7)
Alkaline Phosphatase: 66 IU/L (ref 39–117)
BUN/Creatinine Ratio: 18 (ref 10–24)
BUN: 21 mg/dL (ref 8–27)
Bilirubin Total: 0.7 mg/dL (ref 0.0–1.2)
CO2: 24 mmol/L (ref 20–29)
Calcium: 9.8 mg/dL (ref 8.6–10.2)
Chloride: 92 mmol/L — ABNORMAL LOW (ref 96–106)
Creatinine, Ser: 1.14 mg/dL (ref 0.76–1.27)
GFR calc Af Amer: 73 mL/min/{1.73_m2} (ref >59)
GFR calc non Af Amer: 63 mL/min/{1.73_m2} (ref >59)
Globulin, Total: 2.4 g/dL (ref 1.5–4.5)
Glucose: 86 mg/dL (ref 65–99)
Potassium: 4.7 mmol/L (ref 3.5–5.2)
Sodium: 131 mmol/L — ABNORMAL LOW (ref 134–144)
Total Protein: 7.2 g/dL (ref 6.0–8.5)

## 2019-06-23 LAB — LIPID PANEL
Chol/HDL Ratio: 2.6 ratio (ref 0.0–5.0)
Cholesterol, Total: 148 mg/dL (ref 100–199)
HDL: 57 mg/dL (ref >39)
LDL Chol Calc (NIH): 71 mg/dL (ref 0–99)
Triglycerides: 110 mg/dL (ref 0–149)
VLDL Cholesterol Cal: 20 mg/dL (ref 5–40)

## 2019-06-23 NOTE — Progress Notes (Signed)
Cardiology Office Note:    Date:  06/23/2019   ID:  Jason Bauer, Jason Bauer [birth year], MRN [id number]  PCP:  Street, Sharon Mt, MD  Cardiologist:  Shirlee More, MD    Referring MD: [address], Sharon Mt, *    ASSESSMENT:    1. Coronary artery disease of native artery of native heart with stable angina pectoris ([place])   2. Essential hypertension   3. Mixed hyperlipidemia    PLAN:    In order of problems listed above:  1. Stable CAD having no anginal discomfort Heart Association class I we will continue his long-term dual antiplatelet therapy is well-tolerated without GI upset or bleeding complication along with beta-blocker and statin.  At this time I do not think he needs an ischemic evaluation. 2. BP at target continue treatment including ACE inhibitor thiazide diuretic will check renal function potassium 3. Will continue with statin check liver function lipid profile goal LDL less than 100   Next appointment: 6 months   Medication Adjustments/Labs and Tests Ordered: Current medicines are reviewed at length with the patient today.  Concerns regarding medicines are outlined above.  Orders Placed This Encounter  Procedures  . Comp Met (CMET)  . Lipid panel   No orders of the defined types were placed in this encounter.   No chief complaint on file.   History of Present Illness:    Jason Bauer is a 74 y.o. male with a hx of CAD PCI and stent left anterior descending artery in 2003 hypertension and hyperlipidemia   He wants last seen 12/22/2018. Compliance with diet, lifestyle and medications: Yes  [place] has no exercise intolerance angina dyspnea palpitation or syncope.  He follows with the [facility] he is in the process of VA disability COVID-19 exposure with his coronary artery disease.  He will have an echocardiogram done as part of the disability determination next week at the [facility] hospital.  His most recent  lipid profile 10/25/2018 cholesterol 175 HDL 57 LDL at goal 92 A1c 6.1% normal renal and liver function. Past Medical History:  Diagnosis Date  . Anxiety   . Carcinoma of oropharynx ([place]) 07/30/2011  . Coronary artery disease involving native coronary artery of native heart 08/01/2015   PCI and stent LAD 2003  . Diverticulosis   . Essential hypertension 08/01/2015  . GERD (gastroesophageal reflux disease)   . Hiatal hernia   . Hyperlipidemia 08/01/2015  . Hypothyroidism   . Preoperative cardiovascular examination 08/01/2015  . PTSD (post-traumatic stress disorder)     Past Surgical History:  Procedure Laterality Date  . ANAL FISTULOTOMY    . CARDIAC CATHETERIZATION    . CORONARY ANGIOPLASTY    . HEMORRHOID SURGERY    . TONSILLECTOMY      Current Medications: Current Meds  Medication Sig  . ALPRAZolam (XANAX) 0.5 MG tablet Take 0.5 mg by mouth 2 (two) times daily.   Marland Kitchen aspirin EC 81 MG tablet Take 81 mg by mouth daily.  Marland Kitchen atorvastatin (LIPITOR) 10 MG tablet Take 10 mg by mouth daily.  . carvedilol (COREG) 6.25 MG tablet Take 1 tablet (6.25 mg total) by mouth 2 (two) times daily. Take 1 extra tablet if your systolic BP (top number) is greater than 160 mid-day.  . clopidogrel (PLAVIX) 75 MG tablet Take 75 mg by mouth daily.  . Coenzyme Q10 (CO Q 10) 100 MG CAPS Take 1 capsule by mouth daily.  Marland Kitchen levothyroxine (SYNTHROID) 112  MCG tablet Take 112 mcg by mouth daily.   Marland Kitchen lisinopril-hydrochlorothiazide (ZESTORETIC) 20-12.5 MG tablet Take 1 tablet by mouth 2 (two) times a day.  . Magnesium Oxide 420 MG TABS Take 2 tablets by mouth daily.  . Multiple Vitamin (MULTIVITAMIN) capsule Take 1 capsule by mouth daily.  . pantoprazole (PROTONIX) 40 MG tablet Take 40 mg by mouth daily.   . sertraline (ZOLOFT) 50 MG tablet Take 50 mg by mouth in the morning and at bedtime. Take 0.5 tablet twice daily     Allergies:   Patient has no known allergies.   Social History   Socioeconomic History  . Marital  status: Married    Spouse name: Not on file  . Number of children: Not on file  . Years of education: Not on file  . Highest education level: Not on file  Occupational History  . Not on file  Tobacco Use  . Smoking status: Never Smoker  . Smokeless tobacco: Never Used  Substance and Sexual Activity  . Alcohol use: Never  . Drug use: Never  . Sexual activity: Yes  Other Topics Concern  . Not on file  Social History Narrative  . Not on file   Social Determinants of Health   Financial Resource Strain:   . Difficulty of Paying Living Expenses: Not on file  Food Insecurity:   . Worried About Charity fundraiser in the Last Year: Not on file  . Ran Out of Food in the Last Year: Not on file  Transportation Needs:   . Lack of Transportation (Medical): Not on file  . Lack of Transportation (Non-Medical): Not on file  Physical Activity:   . Days of Exercise per Week: Not on file  . Minutes of Exercise per Session: Not on file  Stress:   . Feeling of Stress : Not on file  Social Connections:   . Frequency of Communication with Friends and Family: Not on file  . Frequency of Social Gatherings with Friends and Family: Not on file  . Attends Religious Services: Not on file  . Active Member of Clubs or Organizations: Not on file  . Attends Archivist Meetings: Not on file  . Marital Status: Not on file     Family History: The patient's family history includes Bladder Cancer in his father; CAD in his father; Cancer in his father; Diabetes in his paternal grandfather; Hypertension in his father; Scoliosis in his mother. ROS:   Please see the history of present illness.    All other systems reviewed and are negative.  EKGs/Labs/Other Studies Reviewed:    The following studies were reviewed today:   Recent Labs: No results found for requested labs within last 8760 hours.  Recent Lipid Panel No results found for: CHOL, TRIG, HDL, CHOLHDL, VLDL, LDLCALC,  LDLDIRECT  Physical Exam:    VS:  BP 138/78   Pulse (!) 54   Temp 97.7 F (36.5 C)   Ht 5' 10"  (1.778 m)   Wt 190 lb (86.2 kg)   SpO2 99%   BMI 27.26 kg/m     Wt Readings from Last 3 Encounters:  06/23/19 190 lb (86.2 kg)  12/22/18 188 lb 9.6 oz (85.5 kg)  11/03/18 186 lb 9.6 oz (84.6 kg)     GEN:  Well nourished, well developed in no acute distress HEENT: Normal NECK: No JVD; No carotid bruits LYMPHATICS: No lymphadenopathy CARDIAC: RRR, no murmurs, rubs, gallops RESPIRATORY:  Clear to auscultation without rales, wheezing  or rhonchi  ABDOMEN: Soft, non-tender, non-distended MUSCULOSKELETAL:  No edema; No deformity  SKIN: Warm and dry NEUROLOGIC:  Alert and oriented x 3 PSYCHIATRIC:  Normal affect    Signed, Shirlee More, MD  06/23/2019 11:09 AM    [place]

## 2019-06-23 NOTE — Patient Instructions (Signed)
Medication Instructions:  Your physician recommends that you continue on your current medications as directed. Please refer to the Current Medication list given to you today.  *If you need a refill on your cardiac medications before your next appointment, please call your pharmacy*  Lab Work: TODAY:  LIPID / CMP If you have labs (blood work) drawn today and your tests are completely normal, you will receive your results only by: Marland Kitchen MyChart Message (if you have MyChart) OR . A paper copy in the mail If you have any lab test that is abnormal or we need to change your treatment, we will call you to review the results.  Testing/Procedures: None ordered  Follow-Up: At [facility], you and your health needs are our priority.  As part of our continuing mission to provide you with exceptional heart care, we have created designated Provider Care Teams.  These Care Teams include your primary Cardiologist (physician) and Advanced Practice Providers (APPs -  Physician Assistants and Nurse Practitioners) who all work together to provide you with the care you need, when you need it.  Your next appointment:   6 month(s)  The format for your next appointment:   In Person  Provider:   Shirlee More, MD  Other Instructions

## 2019-09-05 ENCOUNTER — Other Ambulatory Visit: Payer: Self-pay | Admitting: Cardiology

## 2019-09-06 ENCOUNTER — Telehealth: Payer: Self-pay

## 2019-09-06 NOTE — Telephone Encounter (Signed)
He still needs to take lisinopril 20 mg daily with hydrochlorothiazide 12.5 mg daily.  If he 1 things in pills that is fine but both medications still need to be on his medication regimen.

## 2019-09-06 NOTE — Telephone Encounter (Signed)
Pt sent refill request for his Lisinopril-HTCZ 20-12.5 Mg however would like to know if he can switch it to Lisinopril 20 mg.  Pt states the doctor switched him to a combo pill and does does not like it.  It has been giving him a fit.

## 2019-09-09 MED ORDER — HYDROCHLOROTHIAZIDE 12.5 MG PO TABS: 12.5000 mg | ORAL_TABLET | Freq: Two times a day (BID) | ORAL | 3 refills | Status: DC

## 2019-09-09 MED ORDER — LISINOPRIL 20 MG PO TABS: 20.0000 mg | ORAL_TABLET | Freq: Two times a day (BID) | ORAL | 3 refills | Status: DC

## 2019-09-09 NOTE — Addendum Note (Signed)
Addended by: Truddie Hidden on: 09/09/2019 04:59 PM   Modules accepted: Orders

## 2019-09-09 NOTE — Telephone Encounter (Signed)
Called and spoke with patient, informed him that he needs to be taking both Lisinopril 20 mg and the Hydrochlorothiazide 12.5 mg.  Patient is ok with having 2 pills rather than the combo pill.  Please send into [facility].

## 2019-10-01 ENCOUNTER — Other Ambulatory Visit: Payer: Self-pay | Admitting: Cardiology

## 2019-10-24 DIAGNOSIS — R319 Hematuria, unspecified: Secondary | ICD-10-CM | POA: Diagnosis not present

## 2019-10-24 DIAGNOSIS — N2889 Other specified disorders of kidney and ureter: Secondary | ICD-10-CM | POA: Diagnosis not present

## 2019-10-25 ENCOUNTER — Telehealth: Payer: Self-pay | Admitting: Cardiology

## 2019-10-25 DIAGNOSIS — N2889 Other specified disorders of kidney and ureter: Secondary | ICD-10-CM

## 2019-10-25 NOTE — Telephone Encounter (Signed)
Left patient a detailed message and let him know that the referral has been placed. I faxed the referral this morning. I also gave our call back number to call back with any questions or concerns.

## 2019-10-25 NOTE — Telephone Encounter (Signed)
Follow Up  Jason Bauer is calling in from Urology in [place]. States that the referral was received, but they need more information. The referral was missing the diagnoses, office notes, and face sheet with insurance information. Please assist.

## 2019-10-25 NOTE — Telephone Encounter (Signed)
Records faxed at this time

## 2019-10-25 NOTE — Telephone Encounter (Signed)
Patient called and stated that he was diagnosed with a mass on his left kidney by an ER physician down at the [facility], and that Dr. Bettina Gavia recommended Dr. Frances Furbish (Urologist). Patient is requesting a formal referral be sent in, if not already done, to Dr. Lottie Rater, so he could be seen as soon as possible.

## 2019-10-25 NOTE — Telephone Encounter (Signed)
Yes please refer him he called me through the hospital switchboard and asked me for a physician at [facility] to see him for his renal mass.

## 2019-10-27 ENCOUNTER — Other Ambulatory Visit: Payer: Self-pay | Admitting: Urology

## 2019-10-27 ENCOUNTER — Telehealth: Payer: Self-pay | Admitting: Cardiology

## 2019-10-27 DIAGNOSIS — R310 Gross hematuria: Secondary | ICD-10-CM | POA: Diagnosis not present

## 2019-10-27 NOTE — Telephone Encounter (Signed)
   [facility] Pre-operative Risk Assessment    HEARTCARE STAFF: - Please ensure there is not already an duplicate clearance open for this procedure. - Under Visit Info/Reason for Call, type in Other and utilize the format Clearance MM/DD/YY or Clearance TBD. Do not use dashes or single digits. - If request is for dental extraction, please clarify the # of teeth to be extracted.  Request for surgical clearance:  1. What type of surgery is being performed? cystoscopy ureteroscopy with biopsy and ureteral stent placement  2. When is this surgery scheduled? 11/24/2019  3. What type of clearance is required (medical clearance vs. Pharmacy clearance to hold med vs. Both)? Both  4. Are there any medications that need to be held prior to surgery and how long? Okay to stay on aspirin, hold plavix for 5 days prior  5. Practice name and name of physician performing surgery? [facility], Dr. Raynelle Bring  6. What is the office phone number? [phone]   7.   What is the office fax number? [phone]  8.   Anesthesia type (None, local, MAC, general) ? general   Jason Bauer 10/27/2019, 3:50 PM  _________________________________________________________________   (provider comments below)

## 2019-10-28 NOTE — Telephone Encounter (Signed)
He can safely withdraw clopidogrel 1 week prior to surgery and resume when safe from bleeding perspective determined by his surgeon

## 2019-10-28 NOTE — Telephone Encounter (Signed)
° °  Primary Cardiologist: Shirlee More, MD  Chart reviewed as part of pre-operative protocol coverage. Given past medical history and time since last visit, based on ACC/AHA guidelines, Jason Bauer would be at acceptable risk for the planned procedure without further cardiovascular testing.   He may hold his Plavix for 1 week prior to his surgery.  Please resume as soon as hemostasis is achieved.  I will route this recommendation to the requesting party via Epic fax function and remove from pre-op pool.  Please call with questions.  Jossie Ng. Riverlyn Kizziah NP-C    10/28/2019, 10:52 AM [place] [address] Office [phone] Fax [phone]

## 2019-11-01 ENCOUNTER — Other Ambulatory Visit: Payer: Self-pay | Admitting: Urology

## 2019-11-03 DIAGNOSIS — K862 Cyst of pancreas: Secondary | ICD-10-CM | POA: Diagnosis not present

## 2019-11-03 DIAGNOSIS — K5730 Diverticulosis of large intestine without perforation or abscess without bleeding: Secondary | ICD-10-CM | POA: Diagnosis not present

## 2019-11-03 DIAGNOSIS — K8689 Other specified diseases of pancreas: Secondary | ICD-10-CM | POA: Diagnosis not present

## 2019-11-03 DIAGNOSIS — R310 Gross hematuria: Secondary | ICD-10-CM | POA: Diagnosis not present

## 2019-11-03 DIAGNOSIS — I700 Atherosclerosis of aorta: Secondary | ICD-10-CM | POA: Diagnosis not present

## 2019-11-03 DIAGNOSIS — J8410 Pulmonary fibrosis, unspecified: Secondary | ICD-10-CM | POA: Diagnosis not present

## 2019-11-03 DIAGNOSIS — I2510 Atherosclerotic heart disease of native coronary artery without angina pectoris: Secondary | ICD-10-CM | POA: Diagnosis not present

## 2019-11-10 ENCOUNTER — Other Ambulatory Visit: Payer: Self-pay | Admitting: Urology

## 2019-11-15 NOTE — Patient Instructions (Addendum)
DUE TO COVID-19 ONLY ONE VISITOR IS ALLOWED TO COME WITH YOU AND STAY IN THE WAITING ROOM ONLY DURING PRE OP AND PROCEDURE DAY OF SURGERY. THE 2 VISITORS MAY VISIT WITH YOU AFTER SURGERY IN YOUR PRIVATE ROOM DURING VISITING HOURS ONLY!  YOU NEED TO HAVE A COVID 19 TEST ON: 11/28/19 @       , THIS TEST MUST BE DONE BEFORE SURGERY, [place] ,[place]. ONCE YOUR COVID TEST IS COMPLETED, PLEASE BEGIN THE QUARANTINE INSTRUCTIONS AS OUTLINED IN YOUR HANDOUT.                Jason Bauer    Your procedure is scheduled on: 12/01/19   Report to [facility] Main  Entrance   Report to admitting at: 7:47 AM     Call this number if you have problems the morning of surgery [phone]    Remember: Do not eat solid food :After Midnight.      BRUSH YOUR TEETH MORNING OF SURGERY AND RINSE YOUR MOUTH OUT, NO CHEWING GUM CANDY OR MINTS.     Take these medicines the morning of surgery with A SIP OF WATER: carvedilol,levothyroxine,pantoprazole,sertraline.                               You may not have any metal on your body including              piercings  Do not wear jewelry, lotions, powders or deodorant             Men may shave face and neck.   Do not bring valuables to the hospital. [place].  Contacts, dentures or bridgework may not be worn into surgery.       Special Instructions: N/A              Please read over the following fact sheets you were given: _____________________________________________________________________         [facility] - Preparing for Surgery  Before surgery, you can play an important role.   Because skin is not sterile, your skin needs to be as free of germs as possible .  You can reduce the number of germs on your skin by washing with CHG (chlorahexidine gluconate) soap before surgery.   CHG is an antiseptic cleaner which kills germs and bonds with the skin to continue killing  germs even after washing. Please DO NOT use if you have an allergy to CHG or antibacterial soaps.   If your skin becomes reddened/irritated stop using the CHG and inform your nurse when you arrive at Short Stay.   You may shave your face/neck.  Please follow these instructions carefully:  1.  Shower with CHG Soap the night before surgery and the  morning of Surgery.  2.  If you choose to wash your hair, wash your hair first as usual with your  normal  shampoo.  3.  After you shampoo, rinse your hair and body thoroughly to remove the  shampoo.                                        4.  Use CHG as you would any other liquid soap.  You can  apply chg directly  to the skin and wash                       Gently with a scrungie or clean washcloth.  5.  Apply the CHG Soap to your body ONLY FROM THE NECK DOWN.   Do not use on face/ open                           Wound or open sores. Avoid contact with eyes, ears mouth and genitals (private parts).                       Wash face,  Genitals (private parts) with your normal soap.             6.  Wash thoroughly, paying special attention to the area where your surgery  will be performed.  7.  Thoroughly rinse your body with warm water from the neck down.  8.  DO NOT shower/wash with your normal soap after using and rinsing off  the CHG Soap.             9.  Pat yourself dry with a clean towel.            10.  Wear clean pajamas.            11.  Place clean sheets on your bed the night of your first shower and do not  sleep with pets. Day of Surgery : Do not apply any lotions/deodorants the morning of surgery.  Please wear clean clothes to the hospital/surgery center.  FAILURE TO FOLLOW THESE INSTRUCTIONS MAY RESULT IN THE CANCELLATION OF YOUR SURGERY PATIENT SIGNATURE_________________________________  NURSE SIGNATURE__________________________________   Incentive Spirometer  An incentive spirometer is a tool that can help keep your lungs clear and  active. This tool measures how well you are filling your lungs with each breath. Taking long deep breaths may help reverse or decrease the chance of developing breathing (pulmonary) problems (especially infection) following:  A long period of time when you are unable to move or be active. BEFORE THE PROCEDURE   If the spirometer includes an indicator to show your best effort, your nurse or respiratory therapist will set it to a desired goal.  If possible, sit up straight or lean slightly forward. Try not to slouch.  Hold the incentive spirometer in an upright position. INSTRUCTIONS FOR USE  1. Sit on the edge of your bed if possible, or sit up as far as you can in bed or on a chair. 2. Hold the incentive spirometer in an upright position. 3. Breathe out normally. 4. Place the mouthpiece in your mouth and seal your lips tightly around it. 5. Breathe in slowly and as deeply as possible, raising the piston or the ball toward the top of the column. 6. Hold your breath for 3-5 seconds or for as long as possible. Allow the piston or ball to fall to the bottom of the column. 7. Remove the mouthpiece from your mouth and breathe out normally. 8. Rest for a few seconds and repeat Steps 1 through 7 at least 10 times every 1-2 hours when you are awake. Take your time and take a few normal breaths between deep breaths. 9. The spirometer may include an indicator to show your best effort. Use the indicator as a goal to work toward during each repetition. 10. After each set  of 10 deep breaths, practice coughing to be sure your lungs are clear. If you have an incision (the cut made at the time of surgery), support your incision when coughing by placing a pillow or rolled up towels firmly against it. Once you are able to get out of bed, walk around indoors and cough well. You may stop using the incentive spirometer when instructed by your caregiver.  RISKS AND COMPLICATIONS  Take your time so you do not get  dizzy or light-headed.  If you are in pain, you may need to take or ask for pain medication before doing incentive spirometry. It is harder to take a deep breath if you are having pain. AFTER USE  Rest and breathe slowly and easily.  It can be helpful to keep track of a log of your progress. Your caregiver can provide you with a simple table to help with this. If you are using the spirometer at home, follow these instructions: [place] IF:   You are having difficultly using the spirometer.  You have trouble using the spirometer as often as instructed.  Your pain medication is not giving enough relief while using the spirometer.  You develop fever of 100.5 F (38.1 C) or higher. SEEK IMMEDIATE MEDICAL CARE IF:   You cough up bloody sputum that had not been present before.  You develop fever of 102 F (38.9 C) or greater.  You develop worsening pain at or near the incision site. MAKE SURE YOU:   Understand these instructions.  Will watch your condition.  Will get help right away if you are not doing well or get worse. Document Released: 08/25/2006 Document Revised: 07/07/2011 Document Reviewed: 10/26/2006 ExitCare Patient Information 2014 ExitCare, [place].   ________________________________________________________________________  WHAT IS A BLOOD TRANSFUSION? Blood Transfusion Information  A transfusion is the replacement of blood or some of its parts. Blood is made up of multiple cells which provide different functions.  Red blood cells carry oxygen and are used for blood loss replacement.  White blood cells fight against infection.  Platelets control bleeding.  Plasma helps clot blood.  Other blood products are available for specialized needs, such as hemophilia or other clotting disorders. BEFORE THE TRANSFUSION  Who gives blood for transfusions?   Healthy volunteers who are fully evaluated to make sure their blood is safe. This is blood bank  blood. Transfusion therapy is the safest it has ever been in the practice of medicine. Before blood is taken from a donor, a complete history is taken to make sure that person has no history of diseases nor engages in risky social behavior (examples are intravenous drug use or sexual activity with multiple partners). The donor's travel history is screened to minimize risk of transmitting infections, such as malaria. The donated blood is tested for signs of infectious diseases, such as HIV and hepatitis. The blood is then tested to be sure it is compatible with you in order to minimize the chance of a transfusion reaction. If you or a relative donates blood, this is often done in anticipation of surgery and is not appropriate for emergency situations. It takes many days to process the donated blood. RISKS AND COMPLICATIONS Although transfusion therapy is very safe and saves many lives, the main dangers of transfusion include:   Getting an infectious disease.  Developing a transfusion reaction. This is an allergic reaction to something in the blood you were given. Every precaution is taken to prevent this. The decision to have a  blood transfusion has been considered carefully by your caregiver before blood is given. Blood is not given unless the benefits outweigh the risks. AFTER THE TRANSFUSION  Right after receiving a blood transfusion, you will usually feel much better and more energetic. This is especially true if your red blood cells have gotten low (anemic). The transfusion raises the level of the red blood cells which carry oxygen, and this usually causes an energy increase.  The nurse administering the transfusion will monitor you carefully for complications. HOME CARE INSTRUCTIONS  No special instructions are needed after a transfusion. You may find your energy is better. Speak with your caregiver about any limitations on activity for underlying diseases you may have. SEEK MEDICAL CARE IF:    Your condition is not improving after your transfusion.  You develop redness or irritation at the intravenous (IV) site. SEEK IMMEDIATE MEDICAL CARE IF:  Any of the following symptoms occur over the next 12 hours:  Shaking chills.  You have a temperature by mouth above 102 F (38.9 C), not controlled by medicine.  Chest, back, or muscle pain.  People around you feel you are not acting correctly or are confused.  Shortness of breath or difficulty breathing.  Dizziness and fainting.  You get a rash or develop hives.  You have a decrease in urine output.  Your urine turns a dark color or changes to pink, red, or brown. Any of the following symptoms occur over the next 10 days:  You have a temperature by mouth above 102 F (38.9 C), not controlled by medicine.  Shortness of breath.  Weakness after normal activity.  The white part of the eye turns yellow (jaundice).  You have a decrease in the amount of urine or are urinating less often.  Your urine turns a dark color or changes to pink, red, or brown. Document Released: 04/11/2000 Document Revised: 07/07/2011 Document Reviewed: 11/29/2007 [facility] Patient Information 2014 ExitCare, [place].  _______________________________________________________________________ ________________________________________________________________________

## 2019-11-16 ENCOUNTER — Other Ambulatory Visit: Payer: Self-pay

## 2019-11-16 ENCOUNTER — Encounter (HOSPITAL_COMMUNITY): Payer: Self-pay

## 2019-11-16 ENCOUNTER — Encounter (HOSPITAL_COMMUNITY)
Admission: RE | Admit: 2019-11-16 | Discharge: 2019-11-16 | Disposition: A | Discharge status: Home / Self Care | Payer: Medicare Other | Source: Ambulatory Visit | Attending: Urology | Admitting: Urology

## 2019-11-16 DIAGNOSIS — R001 Bradycardia, unspecified: Secondary | ICD-10-CM | POA: Diagnosis not present

## 2019-11-16 DIAGNOSIS — Z01818 Encounter for other preprocedural examination: Secondary | ICD-10-CM | POA: Diagnosis not present

## 2019-11-16 DIAGNOSIS — I4510 Unspecified right bundle-branch block: Secondary | ICD-10-CM | POA: Diagnosis not present

## 2019-11-16 HISTORY — DX: Unspecified osteoarthritis, unspecified site: M19.90

## 2019-11-16 HISTORY — DX: Depression, unspecified: F32.A

## 2019-11-16 HISTORY — DX: Chronic kidney disease, unspecified: N18.9

## 2019-11-16 LAB — CBC
HCT: 38.5 % — ABNORMAL LOW (ref 39.0–52.0)
Hemoglobin: 12.2 g/dL — ABNORMAL LOW (ref 13.0–17.0)
MCH: 30.1 pg (ref 26.0–34.0)
MCHC: 31.7 g/dL (ref 30.0–36.0)
MCV: 95.1 fL (ref 80.0–100.0)
Platelets: 307 10*3/uL (ref 150–400)
RBC: 4.05 MIL/uL — ABNORMAL LOW (ref 4.22–5.81)
RDW: 12.8 % (ref 11.5–15.5)
WBC: 7.5 10*3/uL (ref 4.0–10.5)
nRBC: 0 % (ref 0.0–0.2)

## 2019-11-16 LAB — BASIC METABOLIC PANEL
Anion gap: 7 (ref 5–15)
BUN: 28 mg/dL — ABNORMAL HIGH (ref 8–23)
CO2: 30 mmol/L (ref 22–32)
Calcium: 9.4 mg/dL (ref 8.9–10.3)
Chloride: 100 mmol/L (ref 98–111)
Creatinine, Ser: 1.14 mg/dL (ref 0.61–1.24)
GFR calc Af Amer: >60 mL/min (ref >60)
GFR calc non Af Amer: >60 mL/min (ref >60)
Glucose, Bld: 88 mg/dL (ref 70–99)
Potassium: 4.8 mmol/L (ref 3.5–5.1)
Sodium: 137 mmol/L (ref 135–145)

## 2019-11-16 NOTE — Progress Notes (Signed)
COVID Vaccine Completed:yes Date COVID Vaccine completed:06/10/19 COVID vaccine manufacturer: *[place]   PCP - [place]. Cardiologist - Dr.Brian Munley.:Clearance: 10/28/19 EPIC  Chest x-ray -  EKG -  Stress Test -  ECHO -  Cardiac Cath -   Sleep Study -  CPAP -   Fasting Blood Sugar -  Checks Blood Sugar _____ times a day  Blood Thinner Instructions: Plavix: Hold for a week before surgery. Aspirin Instructions: Hold for a week before surgery. Last Dose:  Anesthesia review: Hx: HTN,CAD. Pt. Is very active he walk a mile or more every day.  Patient denies shortness of breath, fever, cough and chest pain at PAT appointment   Patient verbalized understanding of instructions that were given to them at the PAT appointment. Patient was also instructed that they will need to review over the PAT instructions again at home before surgery.

## 2019-11-18 NOTE — Progress Notes (Signed)
Anesthesia Chart Review   Case: [id number] Date/Time: 11/24/19 1100   Procedure: CYSTOSCOPY/RETROGRADE/URETEROSCOPY/HOLMIUM LASER/STENT PLACEMENT (Left )   Anesthesia type: General   Pre-op diagnosis: HEMATURIA, LEFT RENAL MASS   Location: [facility]   Surgeons: Raynelle Bring, MD      DISCUSSION:73 y.o. never smoker with h/o GERD, HTN, hypothyroidism, CKD, CAD (stent to LAD 2003), hematuria, left renal mass scheduled for above procedure 11/24/2019 with Dr. Raynelle Bring.   Per cardiology preoperative risk assessment 10/28/2019, "Chart reviewed as part of pre-operative protocol coverage. Given past medical history and time since last visit, based on ACC/AHA guidelines, Jason Bauer would be at acceptable risk for the planned procedure without further cardiovascular testing.   He may hold his Plavix for 1 week prior to his surgery.  Please resume as soon as hemostasis is achieved."  Anticipate pt can proceed with planned procedure barring acute status change.   VS: BP 136/73   Pulse (!) 50   Temp 36.9 C (Oral)   Resp 16   Ht 5\' 10"  (1.778 m)   Wt 83 kg   SpO2 100%   BMI 26.26 kg/m   PROVIDERS: [facility], Sharon Mt, MD is PCP   Shirlee More, MD is Cardiologist last seen 06/23/2019 LABS: Labs reviewed: Acceptable for surgery. (all labs ordered are listed, but only abnormal results are displayed)  Labs Reviewed  BASIC METABOLIC PANEL - Abnormal; Notable for the following components:      Result Value   BUN 28 (*)    All other components within normal limits  CBC - Abnormal; Notable for the following components:   RBC 4.05 (*)    Hemoglobin 12.2 (*)    HCT 38.5 (*)    All other components within normal limits     IMAGES:   EKG: 11/16/19 Rate 48 bpm  Sinus bradycardia Right bundle branch block Abnormal ECG No old tracing to compare CV:  Past Medical History:  Diagnosis Date  . Anxiety   . Arthritis   . Carcinoma of oropharynx ([place]) 07/30/2011   . Chronic kidney disease   . Coronary artery disease involving native coronary artery of native heart 08/01/2015   PCI and stent LAD 2003  . Depression   . Diverticulosis   . Essential hypertension 08/01/2015  . GERD (gastroesophageal reflux disease)   . Hiatal hernia   . Hyperlipidemia 08/01/2015  . Hypothyroidism   . Preoperative cardiovascular examination 08/01/2015  . PTSD (post-traumatic stress disorder)   . PTSD (post-traumatic stress disorder)     Past Surgical History:  Procedure Laterality Date  . ANAL FISTULOTOMY    . ARTHROSCOPY KNEE W/ DRILLING Left   . CARDIAC CATHETERIZATION    . CORONARY ANGIOPLASTY    . HEMORRHOID SURGERY    . TONSILLECTOMY      MEDICATIONS: . ALPRAZolam (XANAX) 0.5 MG tablet  . aspirin EC 81 MG tablet  . atorvastatin (LIPITOR) 10 MG tablet  . carvedilol (COREG) 12.5 MG tablet  . carvedilol (COREG) 6.25 MG tablet  . Cholecalciferol (VITAMIN D3) 10 MCG (400 UNIT) tablet  . clopidogrel (PLAVIX) 75 MG tablet  . Coenzyme Q10 (CO Q 10) 100 MG CAPS  . hydrochlorothiazide (HYDRODIURIL) 12.5 MG tablet  . ketotifen (ALLERGY EYE DROPS) 0.025 % ophthalmic solution  . levothyroxine (SYNTHROID) 112 MCG tablet  . lisinopril (ZESTRIL) 20 MG tablet  . lisinopril-hydrochlorothiazide (ZESTORETIC) 20-12.5 MG tablet  . Magnesium Oxide 420 MG TABS  . Nutritional Supplements (JUICE PLUS FIBRE PO)  .  Omega-3 Fatty Acids (FISH OIL) 1000 MG CAPS  . pantoprazole (PROTONIX) 40 MG tablet  . sertraline (ZOLOFT) 50 MG tablet  . vitamin B-12 (CYANOCOBALAMIN) 500 MCG tablet   No current facility-administered medications for this encounter.   Konrad Felix, PA-C [facility] Pre-Surgical Testing [phone]

## 2019-11-18 NOTE — Anesthesia Preprocedure Evaluation (Addendum)
Anesthesia Evaluation  Patient identified by MRN, date of birth, ID band Patient awake    Reviewed: Allergy & Precautions, NPO status , Patient's Chart, lab work & pertinent test results  Airway Mallampati: I  TM Distance: >3 FB Neck ROM: Full    Dental no notable dental hx.    Pulmonary neg pulmonary ROS,    Pulmonary exam normal breath sounds clear to auscultation       Cardiovascular hypertension, Pt. on medications and Pt. on home beta blockers + CAD and + Cardiac Stents  Normal cardiovascular exam Rhythm:Regular Rate:Normal     Neuro/Psych PSYCHIATRIC DISORDERS Anxiety Depression PTSD (post-traumatic stress disorder)negative neurological ROS     GI/Hepatic Neg liver ROS, hiatal hernia, GERD  Medicated and Controlled,  Endo/Other  Hypothyroidism   Renal/GU Renal disease     Musculoskeletal  (+) Arthritis ,   Abdominal   Peds  Hematology  (+) anemia , HLD   Anesthesia Other Findings HEMATURIA LEFT RENAL MASS  Reproductive/Obstetrics                            Anesthesia Physical Anesthesia Plan  ASA: III  Anesthesia Plan: General   Post-op Pain Management:    Induction: Intravenous  PONV Risk Score and Plan: 3 and Ondansetron, Dexamethasone, Treatment may vary due to age or medical condition and Midazolam  Airway Management Planned: LMA  Additional Equipment:   Intra-op Plan:   Post-operative Plan: Extubation in OR  Informed Consent: I have reviewed the patients History and Physical, chart, labs and discussed the procedure including the risks, benefits and alternatives for the proposed anesthesia with the patient or authorized representative who has indicated his/her understanding and acceptance.     Dental advisory given  Plan Discussed with: CRNA  Anesthesia Plan Comments: (Reviewed PAT note 11/16/2019, Konrad Felix, PA-C)      Anesthesia Quick  Evaluation

## 2019-11-21 ENCOUNTER — Other Ambulatory Visit (HOSPITAL_COMMUNITY)
Admission: RE | Admit: 2019-11-21 | Discharge: 2019-11-21 | Disposition: A | Discharge status: Home / Self Care | Payer: Medicare Other | Source: Ambulatory Visit | Attending: Urology | Admitting: Urology

## 2019-11-21 DIAGNOSIS — Z01812 Encounter for preprocedural laboratory examination: Secondary | ICD-10-CM | POA: Insufficient documentation

## 2019-11-21 DIAGNOSIS — Z20822 Contact with and (suspected) exposure to covid-19: Secondary | ICD-10-CM | POA: Diagnosis not present

## 2019-11-21 LAB — SARS CORONAVIRUS 2 (TAT 6-24 HRS): SARS Coronavirus 2: NEGATIVE

## 2019-11-23 NOTE — H&P (Signed)
Office Visit Report     10/27/2019   --------------------------------------------------------------------------------   Jason Bauer  MRN: [id number]  DOB: 1945-06-15, 74 year old Male  SSN: [id number]   PRIMARY CARE:    REFERRING:    PROVIDER:  Raynelle Bauer, M.D.  LOCATION:  [facility] [phone]     --------------------------------------------------------------------------------   CC/HPI: Left renal mass and hematuria   Jason Bauer is a 74 year old gentleman who is in his typical state of health when he developed the acute onset of left lower quadrant pain with associated nausea and vomiting on 06/28. [place] stone study performed. This did not demonstrate a stone but did demonstrate a 6.2 x 5.6 cm heterogeneous appearing left renal mass with evidence of hyperdense material within the proximal left ureter possibly consistent with blood products. He has noted some hematuria as well. He was seen by his primary care physician subsequently referred for further evaluation. His CT scan fortunately did not demonstrate any lymphadenopathy or obvious evidence of metastatic disease.   His past medical history is significant for coronary artery disease status post cardiac stent 2003. He is chronically on anti-platelet therapy with Plavix and aspirin. His cardiologist is Jason Bauer. 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 He has had no evidence for disease recurrence. He also has a history of irritable bowel syndrome, hyperlipidemia, gastroesophageal reflux disease, and hypothyroidism.   His serum creatinine was 1.18. Hemoglobin was 12.1. He denies tobacco use.     ALLERGIES: Hydrocodone-APAP Toprol    MEDICATIONS: Aspirin  Hydrochlorothiazide 12.5 mg capsule  Lipitor 10 mg tablet  Plavix 75 mg tablet   Coq10  Coreg 6.25 mg tablet  Levothyroxine 112 mcg capsule  Lisinopril-Hydrochlorothiazide 20 mg-12.5 mg tablet  Magnesium  Protonix  Sertraline Hcl  Xanax 0.5 mg tablet     GU PSH: None   NON-GU PSH: Hemorrhoidectomy Remove Anal Fistula Tonsillectomy     GU PMH: None   NON-GU PMH: Anxiety Atherosclerotic heart disease of native coronary artery with angina pectoris with documented spasm Diverticulitis Hyperlipidemia, unspecified Hypertension Hypothyroidism    FAMILY HISTORY: Bladder Cancer - Father CAD (coronary artery disease) - Father Cancer - Father Diabetes - Grandfather Hypertension - Father   SOCIAL HISTORY: Marital Status: Married Race: White Current Smoking Status: Patient has never smoked.   Tobacco Use Assessment Completed: Used Tobacco in last 30 days? Has never drank.     REVIEW OF SYSTEMS:    GU Review Male:   Patient denies frequent urination, hard to postpone urination, burning/ pain with urination, get up at night to urinate, leakage of urine, stream starts and stops, trouble starting your streams, and have to strain to urinate .  Gastrointestinal (Upper):   Patient denies nausea and vomiting.  Gastrointestinal (Lower):   Patient denies diarrhea and constipation.  Constitutional:   Patient denies fever, night sweats, weight loss, and fatigue.  Skin:   Patient denies skin rash/ lesion and itching.  Eyes:   Patient denies blurred vision and double vision.  Ears/ Nose/ Throat:   Patient denies sore throat and sinus problems.  Hematologic/Lymphatic:   Patient denies swollen glands and easy bruising.  Cardiovascular:   Patient denies leg swelling and chest pains.  Respiratory:   Patient denies cough and shortness of breath.  Endocrine:   Patient denies excessive  thirst.  Musculoskeletal:   Patient denies back pain and joint pain.  Neurological:   Patient denies headaches and dizziness.  Psychologic:   Patient denies depression and anxiety.    VITAL SIGNS:      10/27/2019 10:03 AM  Weight 189 lb / 85.73 kg  Height 70 in / 177.8 cm  BP 122/67 mmHg  Pulse 54 /min  Temperature 97.3 F / 36.2 C  BMI 27.1 kg/m   MULTI-SYSTEM PHYSICAL EXAMINATION:    Constitutional: Well-nourished. No physical deformities. Normally developed. Good grooming.  Neck: Neck symmetrical, not swollen. Normal tracheal position.  Respiratory: No labored breathing, no use of accessory muscles. Clear bilaterally.  Cardiovascular: Normal temperature, normal extremity pulses, no swelling, no varicosities. Regular rate and rhythm.  Lymphatic: No enlargement of neck, axillae, groin.  Skin: No paleness, no jaundice, no cyanosis. No lesion, no ulcer, no rash.  Neurologic / Psychiatric: Oriented to time, oriented to place, oriented to person. No depression, no anxiety, no agitation.  Gastrointestinal: No mass, no tenderness, no rigidity, non obese abdomen. No CVA tenderness.  Eyes: Normal conjunctivae. Normal eyelids.  Ears, Nose, Mouth, and Throat: Left ear no scars, no lesions, no masses. Right ear no scars, no lesions, no masses. Nose no scars, no lesions, no masses. Normal hearing. Normal lips.  Musculoskeletal: Normal gait and station of head and neck.     Complexity of Data:  X-Ray Review: C.T. Abdomen/Pelvis: Reviewed Films.    Notes:                     I personally reviewed his CT scan. Findings are as dictated above.   PROCEDURES:          Urinalysis w/Scope Dipstick Dipstick Cont'd Micro  Color: Red Bilirubin: Neg mg/dL WBC/hpf: 6 - 10/hpf  Appearance: Turbid Ketones: Trace mg/dL RBC/hpf: Packed/hpf  Specific Gravity: 1.025 Blood: 3+ ery/uL Bacteria: Mod (26-50/hpf)  pH: <=5.0 Protein: 3+ mg/dL Cystals: Amorph Urates  Glucose: Neg mg/dL Urobilinogen: 0.2 mg/dL Casts: NS (Not Seen)    Nitrites: Positive Trichomonas: Not Present    Leukocyte Esterase: 3+ leu/uL Mucous: Present      Epithelial Cells: 0 - 5/hpf      Yeast: NS (Not Seen)       Sperm: Not Present    Notes: MICROSCOPIC NOT CONCENTRATED    ASSESSMENT:      ICD-10 Details  1 GU:   Gross hematuria - R31.0   2   Left renal neoplasm - W54.627    PLAN:           Orders Labs Urine Culture, CBC with Diff, CMP          Schedule X-Rays: 1 Week - C.T. Hematuria With and Without I.V. Contrast    1 Week - C.T. Chest With I.V. Contrast  Return Visit/Planned Activity: Other See Visit Notes             Note: Will call with CT results and to schedule surgery.          Document Letter(s):  Created for Patient: Clinical Summary         Notes:   1. Left renal mass and hematuria: I have recommended that he proceed with further evaluation including a hematuria protocol CT scan. Considering the fact that it does appear that he likely has a left renal malignancy, I also will proceed with staging with a CT scan of the chest. This will be performed in the near future. Laboratory  studies will also be performed today. We will tentatively plan to schedule cystoscopy with left retrograde pyelography, left ureteroscopy with probable biopsy, and left ureteral stent. This will be pending his upcoming CT scan.   His urine will be cultured. He will be treated appropriately as indicated.   Cc: [facility]         Next Appointment:      Next Appointment: 11/24/2019 11:15 AM    Appointment Type: Surgery     Location: [facility] [phone]    Provider: Raynelle Bauer, M.D.    Reason for Visit: WL/OP CYSTO, LEFT RPG, LEFT URS WITH BX, LT UR STENT      * Signed by Jason Bauer, M.D. on 10/27/19 at 4:58 PM (EDT)*

## 2019-11-24 ENCOUNTER — Ambulatory Visit (HOSPITAL_COMMUNITY): Payer: Medicare Other | Admitting: Certified Registered"

## 2019-11-24 ENCOUNTER — Ambulatory Visit (HOSPITAL_COMMUNITY): Payer: Medicare Other | Admitting: Physician Assistant

## 2019-11-24 ENCOUNTER — Ambulatory Visit (HOSPITAL_COMMUNITY): Payer: Medicare Other

## 2019-11-24 ENCOUNTER — Ambulatory Visit (HOSPITAL_COMMUNITY)
Admission: RE | Admit: 2019-11-24 | Discharge: 2019-11-24 | Disposition: A | Discharge status: Home / Self Care | Payer: Medicare Other | Attending: Urology | Admitting: Urology

## 2019-11-24 ENCOUNTER — Encounter (HOSPITAL_COMMUNITY)
Admission: RE | Disposition: A | Discharge status: Home / Self Care | Payer: Self-pay | Source: Home / Self Care | Attending: Urology

## 2019-11-24 DIAGNOSIS — K589 Irritable bowel syndrome without diarrhea: Secondary | ICD-10-CM | POA: Diagnosis not present

## 2019-11-24 DIAGNOSIS — Z955 Presence of coronary angioplasty implant and graft: Secondary | ICD-10-CM | POA: Insufficient documentation

## 2019-11-24 DIAGNOSIS — Z79899 Other long term (current) drug therapy: Secondary | ICD-10-CM | POA: Insufficient documentation

## 2019-11-24 DIAGNOSIS — Z8249 Family history of ischemic heart disease and other diseases of the circulatory system: Secondary | ICD-10-CM | POA: Diagnosis not present

## 2019-11-24 DIAGNOSIS — Z833 Family history of diabetes mellitus: Secondary | ICD-10-CM | POA: Diagnosis not present

## 2019-11-24 DIAGNOSIS — Z885 Allergy status to narcotic agent status: Secondary | ICD-10-CM | POA: Insufficient documentation

## 2019-11-24 DIAGNOSIS — I10 Essential (primary) hypertension: Secondary | ICD-10-CM | POA: Insufficient documentation

## 2019-11-24 DIAGNOSIS — Z85818 Personal history of malignant neoplasm of other sites of lip, oral cavity, and pharynx: Secondary | ICD-10-CM | POA: Insufficient documentation

## 2019-11-24 DIAGNOSIS — Z7982 Long term (current) use of aspirin: Secondary | ICD-10-CM | POA: Diagnosis not present

## 2019-11-24 DIAGNOSIS — D49512 Neoplasm of unspecified behavior of left kidney: Secondary | ICD-10-CM | POA: Diagnosis not present

## 2019-11-24 DIAGNOSIS — E039 Hypothyroidism, unspecified: Secondary | ICD-10-CM | POA: Diagnosis not present

## 2019-11-24 DIAGNOSIS — I2510 Atherosclerotic heart disease of native coronary artery without angina pectoris: Secondary | ICD-10-CM | POA: Insufficient documentation

## 2019-11-24 DIAGNOSIS — M1990 Unspecified osteoarthritis, unspecified site: Secondary | ICD-10-CM | POA: Insufficient documentation

## 2019-11-24 DIAGNOSIS — Z7902 Long term (current) use of antithrombotics/antiplatelets: Secondary | ICD-10-CM | POA: Insufficient documentation

## 2019-11-24 DIAGNOSIS — K449 Diaphragmatic hernia without obstruction or gangrene: Secondary | ICD-10-CM | POA: Insufficient documentation

## 2019-11-24 DIAGNOSIS — E785 Hyperlipidemia, unspecified: Secondary | ICD-10-CM | POA: Insufficient documentation

## 2019-11-24 DIAGNOSIS — F329 Major depressive disorder, single episode, unspecified: Secondary | ICD-10-CM | POA: Diagnosis not present

## 2019-11-24 DIAGNOSIS — Z888 Allergy status to other drugs, medicaments and biological substances status: Secondary | ICD-10-CM | POA: Insufficient documentation

## 2019-11-24 DIAGNOSIS — Z923 Personal history of irradiation: Secondary | ICD-10-CM | POA: Insufficient documentation

## 2019-11-24 DIAGNOSIS — Z809 Family history of malignant neoplasm, unspecified: Secondary | ICD-10-CM | POA: Insufficient documentation

## 2019-11-24 DIAGNOSIS — F419 Anxiety disorder, unspecified: Secondary | ICD-10-CM | POA: Insufficient documentation

## 2019-11-24 DIAGNOSIS — R310 Gross hematuria: Secondary | ICD-10-CM | POA: Insufficient documentation

## 2019-11-24 DIAGNOSIS — K219 Gastro-esophageal reflux disease without esophagitis: Secondary | ICD-10-CM | POA: Insufficient documentation

## 2019-11-24 DIAGNOSIS — Z9221 Personal history of antineoplastic chemotherapy: Secondary | ICD-10-CM | POA: Diagnosis not present

## 2019-11-24 DIAGNOSIS — F4310 Post-traumatic stress disorder, unspecified: Secondary | ICD-10-CM | POA: Insufficient documentation

## 2019-11-24 DIAGNOSIS — R319 Hematuria, unspecified: Secondary | ICD-10-CM | POA: Diagnosis not present

## 2019-11-24 HISTORY — PX: CYSTOSCOPY/URETEROSCOPY/HOLMIUM LASER/STENT PLACEMENT: SHX6546

## 2019-11-24 SURGERY — CYSTOSCOPY/URETEROSCOPY/HOLMIUM LASER/STENT PLACEMENT
Anesthesia: General | Laterality: Left

## 2019-11-24 MED ORDER — FENTANYL CITRATE (PF) 100 MCG/2ML IJ SOLN
INTRAMUSCULAR | Status: AC
  Filled 2019-11-24: qty 2

## 2019-11-24 MED ORDER — ONDANSETRON HCL 4 MG/2ML IJ SOLN
INTRAMUSCULAR | Status: DC | PRN
  Administered 2019-11-24: 4 mg via INTRAVENOUS

## 2019-11-24 MED ORDER — PROPOFOL 10 MG/ML IV BOLUS
INTRAVENOUS | Status: AC
  Filled 2019-11-24: qty 20

## 2019-11-24 MED ORDER — DEXAMETHASONE SODIUM PHOSPHATE 10 MG/ML IJ SOLN
INTRAMUSCULAR | Status: DC | PRN
  Administered 2019-11-24: 4 mg via INTRAVENOUS

## 2019-11-24 MED ORDER — LIDOCAINE 2% (20 MG/ML) 5 ML SYRINGE
INTRAMUSCULAR | Status: AC
  Filled 2019-11-24: qty 5

## 2019-11-24 MED ORDER — ORAL CARE MOUTH RINSE: 15.0000 mL | Freq: Once | OROMUCOSAL | Status: AC

## 2019-11-24 MED ORDER — SODIUM CHLORIDE 0.9 % IR SOLN
Status: DC | PRN
  Administered 2019-11-24: 6000 mL

## 2019-11-24 MED ORDER — EPHEDRINE SULFATE-NACL 50-0.9 MG/10ML-% IV SOSY
PREFILLED_SYRINGE | INTRAVENOUS | Status: DC | PRN
  Administered 2019-11-24 (×2): 5 mg via INTRAVENOUS
  Administered 2019-11-24 (×2): 10 mg via INTRAVENOUS

## 2019-11-24 MED ORDER — ONDANSETRON HCL 4 MG/2ML IJ SOLN
INTRAMUSCULAR | Status: AC
  Filled 2019-11-24: qty 2

## 2019-11-24 MED ORDER — PHENAZOPYRIDINE HCL 200 MG PO TABS
200.0000 mg | ORAL_TABLET | Freq: Three times a day (TID) | ORAL | 0 refills | Status: DC | PRN
Start: 2019-11-24 — End: 2020-01-20

## 2019-11-24 MED ORDER — DEXAMETHASONE SODIUM PHOSPHATE 10 MG/ML IJ SOLN
INTRAMUSCULAR | Status: AC
  Filled 2019-11-24: qty 1

## 2019-11-24 MED ORDER — LACTATED RINGERS IV SOLN: INTRAVENOUS | Status: DC

## 2019-11-24 MED ORDER — CEFAZOLIN SODIUM-DEXTROSE 2-4 GM/100ML-% IV SOLN
2.0000 g | Freq: Once | INTRAVENOUS | Status: AC
  Administered 2019-11-24: 2 g via INTRAVENOUS
  Filled 2019-11-24: qty 100

## 2019-11-24 MED ORDER — FENTANYL CITRATE (PF) 100 MCG/2ML IJ SOLN
INTRAMUSCULAR | Status: DC | PRN
  Administered 2019-11-24: 25 ug via INTRAVENOUS
  Administered 2019-11-24: 50 ug via INTRAVENOUS
  Administered 2019-11-24: 25 ug via INTRAVENOUS

## 2019-11-24 MED ORDER — CHLORHEXIDINE GLUCONATE 0.12 % MT SOLN
15.0000 mL | Freq: Once | OROMUCOSAL | Status: AC
  Administered 2019-11-24: 15 mL via OROMUCOSAL

## 2019-11-24 MED ORDER — LIDOCAINE 2% (20 MG/ML) 5 ML SYRINGE
INTRAMUSCULAR | Status: DC | PRN
  Administered 2019-11-24: 40 mg via INTRAVENOUS

## 2019-11-24 MED ORDER — ONDANSETRON HCL 4 MG/2ML IJ SOLN: 4.0000 mg | Freq: Once | INTRAMUSCULAR | Status: DC | PRN

## 2019-11-24 MED ORDER — FENTANYL CITRATE (PF) 100 MCG/2ML IJ SOLN: 25.0000 ug | INTRAMUSCULAR | Status: DC | PRN

## 2019-11-24 MED ORDER — PROPOFOL 10 MG/ML IV BOLUS
INTRAVENOUS | Status: DC | PRN
  Administered 2019-11-24: 200 mg via INTRAVENOUS

## 2019-11-24 MED ORDER — ACETAMINOPHEN 10 MG/ML IV SOLN: 1000.0000 mg | Freq: Once | INTRAVENOUS | Status: DC | PRN

## 2019-11-24 MED ORDER — IOHEXOL 300 MG/ML  SOLN
INTRAMUSCULAR | Status: DC | PRN
  Administered 2019-11-24: 8 mL

## 2019-11-24 SURGICAL SUPPLY — 23 items
BAG URO CATCHER STRL LF (MISCELLANEOUS) ×3 IMPLANT
BASKET ZERO TIP NITINOL 2.4FR (BASKET) IMPLANT
BSKT STON RTRVL ZERO TP 2.4FR (BASKET)
CATH INTERMIT  6FR 70CM (CATHETERS) IMPLANT
CLOTH BEACON ORANGE TIMEOUT ST (SAFETY) ×3 IMPLANT
GLOVE BIOGEL M STRL SZ7.5 (GLOVE) ×3 IMPLANT
GOWN STRL REUS W/TWL LRG LVL3 (GOWN DISPOSABLE) ×6 IMPLANT
GUIDEWIRE ANG ZIPWIRE 038X150 (WIRE) IMPLANT
GUIDEWIRE STR DUAL SENSOR (WIRE) ×3 IMPLANT
GUIDEWIRE ZIPWRE .038 STRAIGHT (WIRE) ×3 IMPLANT
IV NS 1000ML (IV SOLUTION) ×3
IV NS 1000ML BAXH (IV SOLUTION) ×1 IMPLANT
KIT TURNOVER KIT A (KITS) IMPLANT
LASER FIB FLEXIVA PULSE ID 365 (Laser) IMPLANT
MANIFOLD NEPTUNE II (INSTRUMENTS) ×3 IMPLANT
PACK CYSTO (CUSTOM PROCEDURE TRAY) ×3 IMPLANT
SHEATH URETERAL 12FRX35CM (MISCELLANEOUS) IMPLANT
STENT URET 6FRX26 CONTOUR (STENTS) ×3 IMPLANT
TRACTIP FLEXIVA PULS ID 200XHI (Laser) IMPLANT
TRACTIP FLEXIVA PULSE ID 200 (Laser)
TUBING CONNECTING 10 (TUBING) ×2 IMPLANT
TUBING CONNECTING 10' (TUBING) ×1
TUBING UROLOGY SET (TUBING) ×3 IMPLANT

## 2019-11-24 NOTE — Interval H&P Note (Signed)
History and Physical Interval Note:  11/24/2019 9:45 AM  Jason Bauer  has presented today for surgery, with the diagnosis of HEMATURIA, LEFT RENAL MASS.  The various methods of treatment have been discussed with the patient and family. After consideration of risks, benefits and other options for treatment, the patient has consented to  Procedure(s): CYSTOSCOPY/RETROGRADE/URETEROSCOPY/HOLMIUM LASER/STENT PLACEMENT (Left) as a surgical intervention.  The patient's history has been reviewed, patient examined, no change in status, stable for surgery.  I have reviewed the patient's chart and labs.  Questions were answered to the patient's satisfaction.     [employer]

## 2019-11-24 NOTE — Transfer of Care (Signed)
Immediate Anesthesia Transfer of Care Note  Patient: Jason Bauer  Procedure(s) Performed: CYSTOSCOPY/RETROGRADE/URETEROSCOPY/STENT PLACEMENT (Left )  Patient Location: PACU  Anesthesia Type:General  Level of Consciousness: awake, alert  and oriented  Airway & Oxygen Therapy: Patient Spontanous Breathing and Patient connected to face mask oxygen  Post-op Assessment: Report given to RN and Post -op Vital signs reviewed and stable  Post vital signs: Reviewed and stable  Last Vitals:  Vitals Value Taken Time  BP    Temp    Pulse    Resp    SpO2      Last Pain:  Vitals:   11/24/19 0942  TempSrc: Oral         Complications: No complications documented.

## 2019-11-24 NOTE — Anesthesia Postprocedure Evaluation (Signed)
Anesthesia Post Note  Patient: Jason Bauer  Procedure(s) Performed: CYSTOSCOPY/RETROGRADE/URETEROSCOPY/STENT PLACEMENT (Left )     Patient location during evaluation: PACU Anesthesia Type: General Level of consciousness: awake Pain management: pain level controlled Vital Signs Assessment: post-procedure vital signs reviewed and stable Respiratory status: spontaneous breathing, nonlabored ventilation, respiratory function stable and patient connected to nasal cannula oxygen Cardiovascular status: blood pressure returned to baseline and stable Postop Assessment: no apparent nausea or vomiting Anesthetic complications: no   No complications documented.  Last Vitals:  Vitals:   11/24/19 1216 11/24/19 1255  BP:  (!) 152/77  Pulse:  56  Resp: 12 16  Temp:    SpO2:  98%    Last Pain:  Vitals:   11/24/19 1255  TempSrc:   PainSc: 0-No pain                 [name]

## 2019-11-24 NOTE — Op Note (Signed)
Preoperative diagnosis: Left renal neoplasm  Postoperative diagnosis: Left renal neoplasm  Procedure: 1.  Cystoscopy 2.  Left retrograde pyelography with interpretation 3.  Left ureteroscopy 4.  Left ureteral stent (6 x 26 -with string)  Surgeon: [name] MD  Anesthesia: General  Complications: None  EBL: Minimal  Intraoperative findings: A left retrograde pyelogram was performed with a 6 French ureteral catheter and Omnipaque contrast.  This revealed a normal caliber ureter although there was some beading of the proximal ureter that appeared abnormal.  The renal collecting system appeared nondilated with some splaying due to the known large mass in the left kidney.  This appeared consistent with extrinsic compression likely due to a renal cell carcinoma rather than a urothelial tumor.  Indication: Jason Bauer is a 74 year old gentleman who had presented with gross hematuria.  He was found to have a large left renal neoplasm concerning for malignancy.  Findings are most consistent with renal cell carcinoma.  However, due to the central location of the mass, he presented today for further endoscopic evaluation to rule out the possibility of urothelial carcinoma.  The potential risks, complications, and the expected recovery process associated with the above procedures were discussed in detail.  Informed consent was obtained.  Description of procedure: The patient was taken to the operating room and general anesthetic was administered.  He was given preoperative antibiotics, placed in the dorsolithotomy position, and prepped and draped in usual sterile fashion.  A preoperative timeout was performed.  Cystourethroscopy was performed.  This demonstrated some evidence of prostatic hyperplasia without other abnormalities.  The bladder appeared normal without evidence of tumors, stones, or other mucosal pathology.  The left ureteral orifice was identified in its expected location.  A 6  French ureteral catheter was used to intubate the left ureteral orifice and Omnipaque contrast was injected.  Findings are as dictated above.  There was noted to be splaying with extrinsic compression on the left renal collecting system that was most consistent with a renal cell carcinoma rather than a urothelial tumor.  Due to the fact that there was an abnormality of the proximal ureter with a "beading" appearance, I did place a 0.38 sensor guidewire.  I then attempted to pass the flexible ureteroscope over a second Glidewire.  However, this was unable to be passed.  I then used the 6 French semirigid ureteroscope.  This was able to be passed up into the proximal ureter in the area of concern.  No intrinsic abnormalities were identified ruling out the possibility of urothelial tumors here.  I then made 1 more attempt to pass the flexible scope all the way up into the renal collecting system but this was still unable to be performed and it was not felt that suspicion was high enough to warrant placement of a ureteral catheter.  As such, the safety wire was left in place and the ureteroscope was withdrawn.  The wire was backloaded on the cystoscope.  A 6 x 26 double-J ureteral stent was advanced over the wire using Seldinger technique and positioned appropriately under fluoroscopic and cystoscopic guidance.  The wire was removed with a good curl noted in the renal pelvis as well as within the bladder.  A string tether was left in place.  The bladder was emptied and the procedure was ended.  He tolerated the procedure well without complications.  He was able to be awakened and transferred to recovery unit in satisfactory condition.

## 2019-11-24 NOTE — Anesthesia Procedure Notes (Signed)
Procedure Name: LMA Insertion Date/Time: 11/24/2019 11:14 AM Performed by: Eben Burow, CRNA Pre-anesthesia Checklist: Patient identified, Emergency Drugs available, Suction available, Patient being monitored and Timeout performed Patient Re-evaluated:Patient Re-evaluated prior to induction Oxygen Delivery Method: Circle system utilized Preoxygenation: Pre-oxygenation with 100% oxygen Induction Type: IV induction Ventilation: Mask ventilation without difficulty LMA: LMA inserted and LMA with gastric port inserted LMA Size: 4.0 Number of attempts: 1 Tube secured with: Tape Dental Injury: Teeth and Oropharynx as per pre-operative assessment

## 2019-11-24 NOTE — Discharge Instructions (Signed)

## 2019-11-25 ENCOUNTER — Encounter (HOSPITAL_COMMUNITY): Payer: Self-pay | Admitting: Urology

## 2019-11-28 ENCOUNTER — Other Ambulatory Visit (HOSPITAL_COMMUNITY)
Admission: RE | Admit: 2019-11-28 | Discharge: 2019-11-28 | Disposition: A | Discharge status: Home / Self Care | Payer: Medicare Other | Source: Ambulatory Visit | Attending: Urology | Admitting: Urology

## 2019-11-28 ENCOUNTER — Other Ambulatory Visit: Payer: Self-pay

## 2019-11-28 ENCOUNTER — Encounter (HOSPITAL_COMMUNITY)
Admission: RE | Admit: 2019-11-28 | Discharge: 2019-11-28 | Disposition: A | Discharge status: Home / Self Care | Payer: Medicare Other | Source: Ambulatory Visit | Attending: Urology | Admitting: Urology

## 2019-11-28 ENCOUNTER — Encounter (HOSPITAL_COMMUNITY): Payer: Self-pay

## 2019-11-28 DIAGNOSIS — Z01812 Encounter for preprocedural laboratory examination: Secondary | ICD-10-CM | POA: Insufficient documentation

## 2019-11-28 DIAGNOSIS — Z20822 Contact with and (suspected) exposure to covid-19: Secondary | ICD-10-CM | POA: Insufficient documentation

## 2019-11-28 HISTORY — DX: Angina pectoris, unspecified: I20.9

## 2019-11-28 LAB — SARS CORONAVIRUS 2 (TAT 6-24 HRS): SARS Coronavirus 2: NEGATIVE

## 2019-11-28 LAB — BASIC METABOLIC PANEL
Anion gap: 8 (ref 5–15)
BUN: 33 mg/dL — ABNORMAL HIGH (ref 8–23)
CO2: 29 mmol/L (ref 22–32)
Calcium: 9.5 mg/dL (ref 8.9–10.3)
Chloride: 102 mmol/L (ref 98–111)
Creatinine, Ser: 1.18 mg/dL (ref 0.61–1.24)
GFR calc Af Amer: >60 mL/min (ref >60)
GFR calc non Af Amer: >60 mL/min (ref >60)
Glucose, Bld: 112 mg/dL — ABNORMAL HIGH (ref 70–99)
Potassium: 5.2 mmol/L — ABNORMAL HIGH (ref 3.5–5.1)
Sodium: 139 mmol/L (ref 135–145)

## 2019-11-28 LAB — CBC
HCT: 38.1 % — ABNORMAL LOW (ref 39.0–52.0)
Hemoglobin: 12.1 g/dL — ABNORMAL LOW (ref 13.0–17.0)
MCH: 30.6 pg (ref 26.0–34.0)
MCHC: 31.8 g/dL (ref 30.0–36.0)
MCV: 96.2 fL (ref 80.0–100.0)
Platelets: 283 10*3/uL (ref 150–400)
RBC: 3.96 MIL/uL — ABNORMAL LOW (ref 4.22–5.81)
RDW: 12.9 % (ref 11.5–15.5)
WBC: 9 10*3/uL (ref 4.0–10.5)
nRBC: 0 % (ref 0.0–0.2)

## 2019-11-28 NOTE — Patient Instructions (Signed)
DUE TO COVID-19 ONLY ONE VISITOR IS ALLOWED TO COME WITH YOU AND STAY IN THE WAITING ROOM ONLY DURING PRE OP AND PROCEDURE DAY OF SURGERY. THE 2 VISITORS MAY VISIT WITH YOU AFTER SURGERY IN YOUR PRIVATE ROOM DURING VISITING HOURS ONLY!    ONCE YOUR COVID TEST IS COMPLETED,  PLEASE BEGIN THE QUARANTINE INSTRUCTIONS AS OUTLINED IN YOUR HANDOUT.                Jason Bauer    Your procedure is scheduled on: 12/01/19   Report to [facility] Main  Entrance   Report to admitting at  7:45 AM     Call this number if you have problems the morning of surgery [phone]    Remember: Do not eat food or drink liquids :After Midnight.    BRUSH YOUR TEETH MORNING OF SURGERY AND RINSE YOUR MOUTH OUT, NO CHEWING GUM CANDY OR MINTS.     Take these medicines the morning of surgery with A SIP OF WATER: Zoloft, Coreg, levothyroxine,Protonix eye drops                                 You may not have any metal on your body including hair pins and              piercings  Do not wear jewelry, make-up, lotions, powders or perfumes, deodorant                         Men may shave face and neck.   Do not bring valuables to the hospital. [place].  Contacts, dentures or bridgework may not be worn into surgery.                 Please read over the following fact sheets you were given: _____________________________________________________________________             [facility] - Preparing for Surgery Before surgery, you can play an important role.   Because skin is not sterile, your skin needs to be as free of germs as possible.   You can reduce the number of germs on your skin by washing with CHG (chlorahexidine gluconate) soap before surgery.  CHG is an antiseptic cleaner which kills germs and bonds with the skin to continue killing germs even after washing. Please DO NOT use if you have an allergy to CHG or antibacterial soaps.    If your skin becomes reddened/irritated stop using the CHG and inform your nurse when you arrive at Short Stay.   You may shave your face/neck.  Please follow these instructions carefully:  1.  Shower with CHG Soap the night before surgery and the  morning of Surgery.  2.  If you choose to wash your hair, wash your hair first as usual with your  normal  shampoo.  3.  After you shampoo, rinse your hair and body thoroughly to remove the  shampoo.                                        4.  Use CHG as you would any other liquid soap.  You can apply chg directly  to the skin and  wash                       Gently with a scrungie or clean washcloth.  5.  Apply the CHG Soap to your body ONLY FROM THE NECK DOWN.   Do not use on face/ open                           Wound or open sores. Avoid contact with eyes, ears mouth and genitals (private parts).                       Wash face,  Genitals (private parts) with your normal soap.             6.  Wash thoroughly, paying special attention to the area where your surgery  will be performed.  7.  Thoroughly rinse your body with warm water from the neck down.  8.  DO NOT shower/wash with your normal soap after using and rinsing off  the CHG Soap.             9.  Pat yourself dry with a clean towel.            10.  Wear clean pajamas.            11.  Place clean sheets on your bed the night of your first shower and do not  sleep with pets. Day of Surgery : Do not apply any lotions/deodorants the morning of surgery.  Please wear clean clothes to the hospital/surgery center.  FAILURE TO FOLLOW THESE INSTRUCTIONS MAY RESULT IN THE CANCELLATION OF YOUR SURGERY PATIENT SIGNATURE_________________________________  NURSE SIGNATURE__________________________________  ________________________________________________________________________

## 2019-11-28 NOTE — Progress Notes (Signed)
COVID Vaccine Completed: yes Date COVID Vaccine completed:06/10/19 COVID vaccine manufacturer: Pfizer     PCP - Dr. Loletha Grayer. Street Cardiologist - Dr. Rinaldo Cloud  Chest x-ray - no EKG - 11/16/19 Stress Test - no ECHO -no Cardiac Cath - 2003  Sleep Study - NA CPAP -   Fasting Blood Sugar - NA Checks Blood Sugar _____ times a day  Blood Thinner Instructions:ASA, Plavix/ Dr. Bettina Gavia Aspirin Instructions:Dr. Alinda Money said to continue it but stop the plavix Last Dose:11/14/19  Anesthesia review:   Patient denies shortness of breath, fever, cough and chest pain at PAT appointment Yes   Patient verbalized understanding of instructions that were given to them at the PAT appointment. Patient was also instructed that they will need to review over the PAT instructions again at home before surgery.  Yes   Pt is fit and walks every day. He reports no SOB climbing stairs or with ADLs  He is a [place] Vet and has PTSD from that time but Zoloft helps.

## 2019-11-29 DIAGNOSIS — R310 Gross hematuria: Secondary | ICD-10-CM | POA: Diagnosis not present

## 2019-11-29 DIAGNOSIS — D49512 Neoplasm of unspecified behavior of left kidney: Secondary | ICD-10-CM | POA: Diagnosis not present

## 2019-11-30 NOTE — H&P (Signed)
Office Visit Report     11/29/2019   --------------------------------------------------------------------------------   Jason Bauer  MRN: [id number]  DOB: 1945/06/13, 74 year old Male  SSN: [id number]   PRIMARY CARE:    REFERRING:  Luvenia Redden  PROVIDER:  Raynelle Bring, M.D.  LOCATION:  [facility] [phone]     --------------------------------------------------------------------------------   CC/HPI: Left renal mass and hematuria   [name] is a 74 year old gentleman who is in his typical state of health when he developed the acute onset of left lower quadrant pain with associated nausea and vomiting on 06/28. [place] stone study performed. This did not demonstrate a stone but did demonstrate a 6.2 x 5.6 cm heterogeneous appearing left renal mass with evidence of hyperdense material within the proximal left ureter possibly consistent with blood products. He has noted some hematuria as well. He was seen by his primary care physician subsequently referred for further evaluation. His CT scan fortunately did not demonstrate any lymphadenopathy or obvious evidence of metastatic disease. A repeat hematuria protocol CT confirmed an enhancing 6.8 cm left renal mass most consistent with renal cell carcinoma but centrally located and still possibly representing a urothelial tumor. Chest imaging was negative for metastatic disease. He underwent endoscopic evaluation on 11/24/19. ?????   His past medical history is significant for coronary artery disease status post cardiac stent 2003. He is chronically on anti-platelet therapy with Plavix and aspirin. His cardiologist is Dr. Shirlee More. He has been cleared to proceed. 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 He has had no evidence for disease recurrence. He also  has a history of irritable bowel syndrome, hyperlipidemia, gastroesophageal reflux disease, and hypothyroidism.   His serum creatinine was 1.18. Hemoglobin was 12.1. He denies tobacco use.     ALLERGIES: Hydrocodone-APAP Toprol    MEDICATIONS: Aspirin  Hydrochlorothiazide 12.5 mg capsule  Lipitor 10 mg tablet  Plavix 75 mg tablet  Coq10  Coreg 6.25 mg tablet  Levothyroxine  Lisinopril-Hydrochlorothiazide 20 mg-12.5 mg tablet  Magnesium  Protonix  Sertraline Hcl  Xanax 0.5 mg tablet     GU PSH: Cystoscopy Insert Stent, Left - 11/24/2019 Cystoscopy Ureteroscopy, Left - 11/24/2019 Locm 300-399Mg /Ml Iodine,1Ml - 11/03/2019, 11/03/2019     NON-GU PSH: Hemorrhoidectomy Remove Anal Fistula Tonsillectomy     GU PMH: Gross hematuria - 10/27/2019 Left renal neoplasm - 10/27/2019    NON-GU PMH: Anxiety Atherosclerotic heart disease of native coronary artery with angina pectoris with documented spasm Diverticulitis Hyperlipidemia, unspecified Hypertension Hypothyroidism    FAMILY HISTORY: Bladder Cancer - Father CAD (coronary artery disease) - Father Cancer - Father Diabetes - Grandfather Hypertension - Father   SOCIAL HISTORY: Marital Status: Married Race: White Current Smoking Status: Patient has never smoked.   Tobacco Use Assessment Completed: Used Tobacco in last 30 days? Has never drank.     REVIEW OF SYSTEMS:    GU Review Male:   Patient denies frequent urination, hard to postpone urination, burning/ pain with urination, get up at night to urinate, leakage of urine, stream starts and stops, trouble starting your streams, and have to strain to urinate .  Gastrointestinal (Upper):   Patient denies nausea and vomiting.  Gastrointestinal (Lower):   Patient denies diarrhea and constipation.  Constitutional:   Patient denies fever, night sweats, weight loss, and fatigue.  Skin:   Patient denies skin rash/ lesion and itching.  Eyes:   Patient denies blurred vision and  double vision.  Ears/ Nose/ Throat:   Patient denies sore throat and sinus problems.  Hematologic/Lymphatic:   Patient denies swollen glands and easy bruising.  Cardiovascular:   Patient denies chest pains and leg swelling.  Respiratory:   Patient denies cough and shortness of breath.  Endocrine:   Patient denies excessive thirst.  Musculoskeletal:   Patient denies back pain and joint pain.  Neurological:   Patient denies headaches and dizziness.  Psychologic:   Patient denies depression and anxiety.   VITAL SIGNS:      11/29/2019 10:58 AM  Weight 180 lb / 81.65 kg  Height 70.5 in / 179.07 cm  BP 151/76 mmHg  Pulse 62 /min  Temperature 97.5 F / 36.3 C  BMI 25.5 kg/m   GU PHYSICAL EXAMINATION:      Notes: He has an indwelling stent with a string noted from the urethra.   MULTI-SYSTEM PHYSICAL EXAMINATION:    Constitutional: Well-nourished. No physical deformities. Normally developed. Good grooming.  Neck: Neck symmetrical, not swollen. Normal tracheal position.  Respiratory: No labored breathing, no use of accessory muscles. Clear bilaterally.  Cardiovascular: Normal temperature, normal extremity pulses, no swelling, no varicosities. Regular rate and rhythm.  Lymphatic: No enlargement of neck, axillae, groin.  Skin: No paleness, no jaundice, no cyanosis. No lesion, no ulcer, no rash.  Neurologic / Psychiatric: Oriented to time, oriented to place, oriented to person. No depression, no anxiety, no agitation.  Gastrointestinal: No mass, no tenderness, no rigidity, non obese abdomen.   Eyes: Normal conjunctivae. Normal eyelids.  Ears, Nose, Mouth, and Throat: Left ear no scars, no lesions, no masses. Right ear no scars, no lesions, no masses. Nose no scars, no lesions, no masses. Normal hearing. Normal lips.  Musculoskeletal: Normal gait and station of head and neck.     Complexity of Data:  Records Review:   Pathology Reports, Previous Patient Records   PROCEDURES:           Urinalysis w/Scope Dipstick Dipstick Cont'd Micro  Color: Amber Bilirubin: Neg mg/dL WBC/hpf: 6 - 10/hpf  Appearance: Slightly Cloudy Ketones: Neg mg/dL RBC/hpf: 20 - 40/hpf  Specific Gravity: 1.020 Blood: 3+ ery/uL Bacteria: Rare (0-9/hpf)  pH: 6.0 Protein: 1+ mg/dL Cystals: NS (Not Seen)  Glucose: Neg mg/dL Urobilinogen: 0.2 mg/dL Casts: NS (Not Seen)    Nitrites: Positive Trichomonas: Not Present    Leukocyte Esterase: 2+ leu/uL Mucous: Present      Epithelial Cells: 0 - 5/hpf      Yeast: NS (Not Seen)      Sperm: Not Present         Notes:   I carefully removed his ureteral stent.   ASSESSMENT:      ICD-10 Details  1 GU:   Left renal neoplasm - D49.512   2   Gross hematuria - R31.0    PLAN:           Orders Labs Urine Culture          Schedule Return Visit/Planned Activity: Keep Scheduled Appointment          Document Letter(s):  Created for Patient: Clinical Summary         Notes:   1. Left renal neoplasm concerning for renal cell carcinoma: We reviewed the results of his recent endoscopic evaluation suggesting that his mass is indeed a renal cell carcinoma more likely than a  urothelial tumor. His ureteral stent was removed today. We have discussed the risks of treatment in detail including but not limited to bleeding, infection, heart attack, stroke, death, venothromoboembolism, cancer recurrence, injury/damage to surrounding organs and structures, urine leak, the possibility of open surgical conversion for patients undergoing minimally invasive surgery, the risk of developing chronic kidney disease and its associated implications, and the potential risk of end stage renal disease possibly necessitating dialysis.   He will plan to proceed with a left laparoscopic radical nephrectomy on Thursday as scheduled. His metastatic evaluation is negative. All questions were answered to his stated satisfaction. He will continue aspirin 81 mg perioperatively but has been cleared to  stop Plavix by his cardiologist and has a already done this.   2. Pancreatic lesion: This will be further evaluated on future imaging. According to the patient, this has been present in the past.   Cc: Dr. Shirlee More          Next Appointment:      Next Appointment: 12/01/2019 10:00 AM    Appointment Type: Surgery     Location: [facility] [phone]    Provider: Raynelle Bring, M.D.    Reason for Visit: WL/IP LEFT LAP RAD NEPHRECTOMY WITH AMANDA      * Signed by Raynelle Bring, M.D. on 11/29/19 at 5:25 PM (EDT)*

## 2019-12-01 ENCOUNTER — Inpatient Hospital Stay (HOSPITAL_COMMUNITY): Payer: Medicare Other | Admitting: Physician Assistant

## 2019-12-01 ENCOUNTER — Other Ambulatory Visit: Payer: Self-pay

## 2019-12-01 ENCOUNTER — Encounter (HOSPITAL_COMMUNITY): Payer: Self-pay | Admitting: Urology

## 2019-12-01 ENCOUNTER — Inpatient Hospital Stay (HOSPITAL_COMMUNITY)
Admission: RE | Admit: 2019-12-01 | Discharge: 2019-12-02 | DRG: 658 | Disposition: A | Discharge status: Home / Self Care | Payer: Medicare Other | Attending: Urology | Admitting: Urology

## 2019-12-01 ENCOUNTER — Encounter (HOSPITAL_COMMUNITY)
Admission: RE | Disposition: A | Discharge status: Home / Self Care | Payer: Self-pay | Source: Home / Self Care | Attending: Urology

## 2019-12-01 DIAGNOSIS — Z79899 Other long term (current) drug therapy: Secondary | ICD-10-CM

## 2019-12-01 DIAGNOSIS — Z8249 Family history of ischemic heart disease and other diseases of the circulatory system: Secondary | ICD-10-CM

## 2019-12-01 DIAGNOSIS — Z20822 Contact with and (suspected) exposure to covid-19: Secondary | ICD-10-CM | POA: Diagnosis present

## 2019-12-01 DIAGNOSIS — E039 Hypothyroidism, unspecified: Secondary | ICD-10-CM | POA: Diagnosis present

## 2019-12-01 DIAGNOSIS — Z7902 Long term (current) use of antithrombotics/antiplatelets: Secondary | ICD-10-CM

## 2019-12-01 DIAGNOSIS — I25119 Atherosclerotic heart disease of native coronary artery with unspecified angina pectoris: Secondary | ICD-10-CM | POA: Diagnosis not present

## 2019-12-01 DIAGNOSIS — Z8052 Family history of malignant neoplasm of bladder: Secondary | ICD-10-CM

## 2019-12-01 DIAGNOSIS — Z7982 Long term (current) use of aspirin: Secondary | ICD-10-CM

## 2019-12-01 DIAGNOSIS — I2510 Atherosclerotic heart disease of native coronary artery without angina pectoris: Secondary | ICD-10-CM | POA: Diagnosis not present

## 2019-12-01 DIAGNOSIS — D49512 Neoplasm of unspecified behavior of left kidney: Secondary | ICD-10-CM | POA: Diagnosis not present

## 2019-12-01 DIAGNOSIS — K219 Gastro-esophageal reflux disease without esophagitis: Secondary | ICD-10-CM | POA: Diagnosis present

## 2019-12-01 DIAGNOSIS — Z833 Family history of diabetes mellitus: Secondary | ICD-10-CM

## 2019-12-01 DIAGNOSIS — C652 Malignant neoplasm of left renal pelvis: Principal | ICD-10-CM | POA: Diagnosis present

## 2019-12-01 DIAGNOSIS — N189 Chronic kidney disease, unspecified: Secondary | ICD-10-CM | POA: Diagnosis not present

## 2019-12-01 DIAGNOSIS — E785 Hyperlipidemia, unspecified: Secondary | ICD-10-CM | POA: Diagnosis present

## 2019-12-01 DIAGNOSIS — D630 Anemia in neoplastic disease: Secondary | ICD-10-CM | POA: Diagnosis not present

## 2019-12-01 DIAGNOSIS — I10 Essential (primary) hypertension: Secondary | ICD-10-CM | POA: Diagnosis present

## 2019-12-01 DIAGNOSIS — R310 Gross hematuria: Secondary | ICD-10-CM | POA: Diagnosis present

## 2019-12-01 DIAGNOSIS — C642 Malignant neoplasm of left kidney, except renal pelvis: Secondary | ICD-10-CM | POA: Diagnosis not present

## 2019-12-01 DIAGNOSIS — I129 Hypertensive chronic kidney disease with stage 1 through stage 4 chronic kidney disease, or unspecified chronic kidney disease: Secondary | ICD-10-CM | POA: Diagnosis not present

## 2019-12-01 DIAGNOSIS — D631 Anemia in chronic kidney disease: Secondary | ICD-10-CM | POA: Diagnosis not present

## 2019-12-01 DIAGNOSIS — N280 Ischemia and infarction of kidney: Secondary | ICD-10-CM | POA: Diagnosis not present

## 2019-12-01 HISTORY — PX: LAPAROSCOPIC NEPHRECTOMY: SHX1930

## 2019-12-01 HISTORY — DX: Neoplasm of unspecified behavior of left kidney: D49.512

## 2019-12-01 LAB — BASIC METABOLIC PANEL
Anion gap: 10 (ref 5–15)
BUN: 28 mg/dL — ABNORMAL HIGH (ref 8–23)
CO2: 26 mmol/L (ref 22–32)
Calcium: 8.7 mg/dL — ABNORMAL LOW (ref 8.9–10.3)
Chloride: 101 mmol/L (ref 98–111)
Creatinine, Ser: 1.24 mg/dL (ref 0.61–1.24)
GFR calc Af Amer: >60 mL/min (ref >60)
GFR calc non Af Amer: 57 mL/min — ABNORMAL LOW (ref >60)
Glucose, Bld: 130 mg/dL — ABNORMAL HIGH (ref 70–99)
Potassium: 4.7 mmol/L (ref 3.5–5.1)
Sodium: 137 mmol/L (ref 135–145)

## 2019-12-01 LAB — TYPE AND SCREEN
ABO/RH(D): A POS
Antibody Screen: NEGATIVE

## 2019-12-01 LAB — ABO/RH: ABO/RH(D): A POS

## 2019-12-01 LAB — HEMOGLOBIN AND HEMATOCRIT, BLOOD
HCT: 34.4 % — ABNORMAL LOW (ref 39.0–52.0)
Hemoglobin: 11 g/dL — ABNORMAL LOW (ref 13.0–17.0)

## 2019-12-01 SURGERY — NEPHRECTOMY, RADICAL, LAPAROSCOPIC, ADULT
Anesthesia: General | Laterality: Left

## 2019-12-01 MED ORDER — ONDANSETRON HCL 4 MG/2ML IJ SOLN
INTRAMUSCULAR | Status: AC
  Filled 2019-12-01: qty 2

## 2019-12-01 MED ORDER — HYDROMORPHONE HCL 1 MG/ML IJ SOLN
0.5000 mg | INTRAMUSCULAR | Status: DC | PRN
  Administered 2019-12-01 (×2): 1 mg via INTRAVENOUS
  Filled 2019-12-01 (×2): qty 1

## 2019-12-01 MED ORDER — FENTANYL CITRATE (PF) 250 MCG/5ML IJ SOLN
INTRAMUSCULAR | Status: DC | PRN
  Administered 2019-12-01 (×3): 50 ug via INTRAVENOUS
  Administered 2019-12-01: 100 ug via INTRAVENOUS
  Administered 2019-12-01 (×3): 50 ug via INTRAVENOUS

## 2019-12-01 MED ORDER — ATORVASTATIN CALCIUM 10 MG PO TABS
10.0000 mg | ORAL_TABLET | Freq: Every day | ORAL | Status: DC
  Administered 2019-12-01: 10 mg via ORAL
  Filled 2019-12-01: qty 1

## 2019-12-01 MED ORDER — LACTATED RINGERS IV SOLN: INTRAVENOUS | Status: DC

## 2019-12-01 MED ORDER — LIDOCAINE 2% (20 MG/ML) 5 ML SYRINGE
INTRAMUSCULAR | Status: DC | PRN
  Administered 2019-12-01: 60 mg via INTRAVENOUS

## 2019-12-01 MED ORDER — ACETAMINOPHEN 10 MG/ML IV SOLN
1000.0000 mg | Freq: Four times a day (QID) | INTRAVENOUS | Status: AC
  Administered 2019-12-01 – 2019-12-02 (×4): 1000 mg via INTRAVENOUS
  Filled 2019-12-01 (×4): qty 100

## 2019-12-01 MED ORDER — TRAMADOL HCL 50 MG PO TABS: 50.0000 mg | ORAL_TABLET | Freq: Four times a day (QID) | ORAL | 0 refills | Status: DC | PRN

## 2019-12-01 MED ORDER — BOOST / RESOURCE BREEZE PO LIQD CUSTOM
1.0000 | Freq: Three times a day (TID) | ORAL | Status: DC
  Administered 2019-12-01 – 2019-12-02 (×3): 1 via ORAL

## 2019-12-01 MED ORDER — FENTANYL CITRATE (PF) 100 MCG/2ML IJ SOLN: 25.0000 ug | INTRAMUSCULAR | Status: DC | PRN

## 2019-12-01 MED ORDER — LISINOPRIL 20 MG PO TABS
20.0000 mg | ORAL_TABLET | Freq: Two times a day (BID) | ORAL | Status: DC
  Administered 2019-12-01 – 2019-12-02 (×3): 20 mg via ORAL
  Filled 2019-12-01 (×3): qty 1

## 2019-12-01 MED ORDER — PROPOFOL 10 MG/ML IV BOLUS
INTRAVENOUS | Status: AC
  Filled 2019-12-01: qty 20

## 2019-12-01 MED ORDER — ROCURONIUM BROMIDE 10 MG/ML (PF) SYRINGE
PREFILLED_SYRINGE | INTRAVENOUS | Status: AC
  Filled 2019-12-01: qty 10

## 2019-12-01 MED ORDER — LACTATED RINGERS IR SOLN
Status: DC | PRN
  Administered 2019-12-01: 1000 mL

## 2019-12-01 MED ORDER — CEFAZOLIN SODIUM-DEXTROSE 1-4 GM/50ML-% IV SOLN
1.0000 g | Freq: Three times a day (TID) | INTRAVENOUS | Status: AC
  Administered 2019-12-01 – 2019-12-02 (×2): 1 g via INTRAVENOUS
  Filled 2019-12-01 (×2): qty 50

## 2019-12-01 MED ORDER — OXYCODONE HCL 5 MG/5ML PO SOLN: 5.0000 mg | Freq: Once | ORAL | Status: DC | PRN

## 2019-12-01 MED ORDER — FENTANYL CITRATE (PF) 250 MCG/5ML IJ SOLN
INTRAMUSCULAR | Status: AC
  Filled 2019-12-01: qty 5

## 2019-12-01 MED ORDER — OXYCODONE HCL 5 MG PO TABS: 5.0000 mg | ORAL_TABLET | Freq: Once | ORAL | Status: DC | PRN

## 2019-12-01 MED ORDER — ONDANSETRON HCL 4 MG/2ML IJ SOLN: 4.0000 mg | Freq: Once | INTRAMUSCULAR | Status: DC | PRN

## 2019-12-01 MED ORDER — FENTANYL CITRATE (PF) 100 MCG/2ML IJ SOLN
INTRAMUSCULAR | Status: AC
  Filled 2019-12-01: qty 2

## 2019-12-01 MED ORDER — SODIUM CHLORIDE (PF) 0.9 % IJ SOLN
INTRAMUSCULAR | Status: AC
  Filled 2019-12-01: qty 20

## 2019-12-01 MED ORDER — PROPOFOL 10 MG/ML IV BOLUS
INTRAVENOUS | Status: DC | PRN
  Administered 2019-12-01: 140 mg via INTRAVENOUS

## 2019-12-01 MED ORDER — PANTOPRAZOLE SODIUM 40 MG PO TBEC
40.0000 mg | DELAYED_RELEASE_TABLET | Freq: Every day | ORAL | Status: DC
  Administered 2019-12-02: 40 mg via ORAL
  Filled 2019-12-01: qty 1

## 2019-12-01 MED ORDER — EPHEDRINE SULFATE-NACL 50-0.9 MG/10ML-% IV SOSY
PREFILLED_SYRINGE | INTRAVENOUS | Status: DC | PRN
  Administered 2019-12-01 (×2): 15 mg via INTRAVENOUS

## 2019-12-01 MED ORDER — SUGAMMADEX SODIUM 500 MG/5ML IV SOLN
INTRAVENOUS | Status: DC | PRN
Start: 2019-12-01 — End: 2019-12-01
  Administered 2019-12-01: 320 mg via INTRAVENOUS

## 2019-12-01 MED ORDER — CHLORHEXIDINE GLUCONATE 0.12 % MT SOLN
15.0000 mL | Freq: Once | OROMUCOSAL | Status: AC
  Administered 2019-12-01: 15 mL via OROMUCOSAL

## 2019-12-01 MED ORDER — LACTATED RINGERS IV SOLN: INTRAVENOUS | Status: DC | PRN

## 2019-12-01 MED ORDER — ALPRAZOLAM 0.5 MG PO TABS
0.5000 mg | ORAL_TABLET | Freq: Every day | ORAL | Status: DC
  Administered 2019-12-01: 0.5 mg via ORAL
  Filled 2019-12-01: qty 1

## 2019-12-01 MED ORDER — ROCURONIUM BROMIDE 10 MG/ML (PF) SYRINGE
PREFILLED_SYRINGE | INTRAVENOUS | Status: DC | PRN
  Administered 2019-12-01 (×4): 20 mg via INTRAVENOUS
  Administered 2019-12-01: 50 mg via INTRAVENOUS

## 2019-12-01 MED ORDER — DEXAMETHASONE SODIUM PHOSPHATE 10 MG/ML IJ SOLN
INTRAMUSCULAR | Status: AC
  Filled 2019-12-01: qty 1

## 2019-12-01 MED ORDER — DEXTROSE-NACL 5-0.45 % IV SOLN: INTRAVENOUS | Status: DC

## 2019-12-01 MED ORDER — ONDANSETRON HCL 4 MG/2ML IJ SOLN
INTRAMUSCULAR | Status: DC | PRN
  Administered 2019-12-01: 4 mg via INTRAVENOUS

## 2019-12-01 MED ORDER — LEVOTHYROXINE SODIUM 112 MCG PO TABS
112.0000 ug | ORAL_TABLET | Freq: Every day | ORAL | Status: DC
  Administered 2019-12-02: 112 ug via ORAL
  Filled 2019-12-01: qty 1

## 2019-12-01 MED ORDER — DEXAMETHASONE SODIUM PHOSPHATE 10 MG/ML IJ SOLN
INTRAMUSCULAR | Status: DC | PRN
  Administered 2019-12-01: 8 mg via INTRAVENOUS

## 2019-12-01 MED ORDER — ORAL CARE MOUTH RINSE: 15.0000 mL | Freq: Once | OROMUCOSAL | Status: AC

## 2019-12-01 MED ORDER — CEFAZOLIN SODIUM-DEXTROSE 2-4 GM/100ML-% IV SOLN
2.0000 g | Freq: Once | INTRAVENOUS | Status: AC
  Administered 2019-12-01: 2 g via INTRAVENOUS
  Filled 2019-12-01: qty 100

## 2019-12-01 MED ORDER — ASPIRIN EC 81 MG PO TBEC
81.0000 mg | DELAYED_RELEASE_TABLET | Freq: Every day | ORAL | Status: DC
  Administered 2019-12-01: 81 mg via ORAL
  Filled 2019-12-01: qty 1

## 2019-12-01 MED ORDER — LIDOCAINE 2% (20 MG/ML) 5 ML SYRINGE
INTRAMUSCULAR | Status: AC
  Filled 2019-12-01: qty 5

## 2019-12-01 MED ORDER — DIPHENHYDRAMINE HCL 50 MG/ML IJ SOLN: 12.5000 mg | Freq: Four times a day (QID) | INTRAMUSCULAR | Status: DC | PRN

## 2019-12-01 MED ORDER — BUPIVACAINE LIPOSOME 1.3 % IJ SUSP
20.0000 mL | Freq: Once | INTRAMUSCULAR | Status: AC
  Administered 2019-12-01: 20 mL
  Filled 2019-12-01: qty 20

## 2019-12-01 MED ORDER — KETOTIFEN FUMARATE 0.025 % OP SOLN
1.0000 [drp] | Freq: Two times a day (BID) | OPHTHALMIC | Status: DC | PRN
  Filled 2019-12-01: qty 5

## 2019-12-01 MED ORDER — DIPHENHYDRAMINE HCL 12.5 MG/5ML PO ELIX: 12.5000 mg | ORAL_SOLUTION | Freq: Four times a day (QID) | ORAL | Status: DC | PRN

## 2019-12-01 MED ORDER — ONDANSETRON HCL 4 MG/2ML IJ SOLN: 4.0000 mg | INTRAMUSCULAR | Status: DC | PRN

## 2019-12-01 MED ORDER — CARVEDILOL 6.25 MG PO TABS
6.2500 mg | ORAL_TABLET | Freq: Two times a day (BID) | ORAL | Status: DC
  Administered 2019-12-01 – 2019-12-02 (×2): 6.25 mg via ORAL
  Filled 2019-12-01 (×2): qty 1

## 2019-12-01 MED ORDER — SODIUM CHLORIDE (PF) 0.9 % IJ SOLN
INTRAMUSCULAR | Status: DC | PRN
  Administered 2019-12-01: 20 mL

## 2019-12-01 MED ORDER — SERTRALINE HCL 25 MG PO TABS
25.0000 mg | ORAL_TABLET | Freq: Every day | ORAL | Status: DC
  Administered 2019-12-02: 25 mg via ORAL
  Filled 2019-12-01: qty 1

## 2019-12-01 MED ORDER — DOCUSATE SODIUM 100 MG PO CAPS
100.0000 mg | ORAL_CAPSULE | Freq: Two times a day (BID) | ORAL | Status: DC
  Administered 2019-12-01 – 2019-12-02 (×2): 100 mg via ORAL
  Filled 2019-12-01 (×2): qty 1

## 2019-12-01 SURGICAL SUPPLY — 49 items
APL PRP STRL LF DISP 70% ISPRP (MISCELLANEOUS) ×1
BAG LAPAROSCOPIC 12 15 PORT 16 (BASKET) ×1 IMPLANT
BAG RETRIEVAL 12/15 (BASKET) ×2
BAG RETRIEVAL 12/15MM (BASKET) ×1
BAG SPEC THK2 15X12 ZIP CLS (MISCELLANEOUS) ×1
BAG ZIPLOCK 12X15 (MISCELLANEOUS) ×3 IMPLANT
BLADE EXTENDED COATED 6.5IN (ELECTRODE) IMPLANT
BLADE SURG SZ10 CARB STEEL (BLADE) IMPLANT
CHLORAPREP W/TINT 26 (MISCELLANEOUS) ×3 IMPLANT
CLIP VESOLOCK LG 6/CT PURPLE (CLIP) ×6 IMPLANT
CLIP VESOLOCK MED LG 6/CT (CLIP) ×3 IMPLANT
CLIP VESOLOCK XL 6/CT (CLIP) IMPLANT
COVER SURGICAL LIGHT HANDLE (MISCELLANEOUS) ×3 IMPLANT
COVER WAND RF STERILE (DRAPES) IMPLANT
CUTTER FLEX LINEAR 45M (STAPLE) ×3 IMPLANT
DERMABOND ADVANCED (GAUZE/BANDAGES/DRESSINGS) ×2
DERMABOND ADVANCED .7 DNX12 (GAUZE/BANDAGES/DRESSINGS) ×1 IMPLANT
DRAPE INCISE IOBAN 66X45 STRL (DRAPES) ×3 IMPLANT
DRAPE LAPAROSCOPIC ABDOMINAL (DRAPES) ×3 IMPLANT
DRAPE WARM FLUID 44X44 (DRAPES) IMPLANT
ELECT PENCIL ROCKER SW 15FT (MISCELLANEOUS) ×3 IMPLANT
ELECT REM PT RETURN 15FT ADLT (MISCELLANEOUS) ×3 IMPLANT
GLOVE BIO SURGEON STRL SZ 6.5 (GLOVE) ×2 IMPLANT
GLOVE BIO SURGEONS STRL SZ 6.5 (GLOVE) ×1
GLOVE BIOGEL M STRL SZ7.5 (GLOVE) ×3 IMPLANT
GOWN STRL REUS W/TWL LRG LVL3 (GOWN DISPOSABLE) ×6 IMPLANT
HEMOSTAT SURGICEL 4X8 (HEMOSTASIS) ×3 IMPLANT
IRRIG SUCT STRYKERFLOW 2 WTIP (MISCELLANEOUS) ×3
IRRIGATION SUCT STRKRFLW 2 WTP (MISCELLANEOUS) ×1 IMPLANT
KIT BASIN OR (CUSTOM PROCEDURE TRAY) ×3 IMPLANT
KIT TURNOVER KIT A (KITS) IMPLANT
RELOAD 45 VASCULAR/THIN (ENDOMECHANICALS) ×3 IMPLANT
SCISSORS LAP 5X35 DISP (ENDOMECHANICALS) ×3 IMPLANT
SET TUBE SMOKE EVAC HIGH FLOW (TUBING) IMPLANT
SHEARS HARMONIC ACE PLUS 36CM (ENDOMECHANICALS) ×3 IMPLANT
SURGIFLO W/THROMBIN 8M KIT (HEMOSTASIS) IMPLANT
SUT MNCRL AB 4-0 PS2 18 (SUTURE) ×6 IMPLANT
SUT PDS AB 1 CTX 36 (SUTURE) ×6 IMPLANT
SUT VIC AB 2-0 SH 27 (SUTURE)
SUT VIC AB 2-0 SH 27X BRD (SUTURE) IMPLANT
SUT VICRYL 0 UR6 27IN ABS (SUTURE) ×6 IMPLANT
TOWEL OR 17X26 10 PK STRL BLUE (TOWEL DISPOSABLE) ×3 IMPLANT
TOWEL OR NON WOVEN STRL DISP B (DISPOSABLE) ×3 IMPLANT
TRAY FOLEY MTR SLVR 16FR STAT (SET/KITS/TRAYS/PACK) ×3 IMPLANT
TRAY LAPAROSCOPIC (CUSTOM PROCEDURE TRAY) ×3 IMPLANT
TROCAR BLADELESS OPT 5 100 (ENDOMECHANICALS) ×3 IMPLANT
TROCAR XCEL 12X100 BLDLESS (ENDOMECHANICALS) ×3 IMPLANT
TROCAR XCEL BLUNT TIP 100MML (ENDOMECHANICALS) ×3 IMPLANT
YANKAUER SUCT BULB TIP 10FT TU (MISCELLANEOUS) IMPLANT

## 2019-12-01 NOTE — Transfer of Care (Signed)
Immediate Anesthesia Transfer of Care Note  Patient: Estell Dillinger  Procedure(s) Performed: LAPAROSCOPIC NEPHRECTOMY (Left )  Patient Location: PACU  Anesthesia Type:General  Level of Consciousness: awake, alert , oriented and patient cooperative  Airway & Oxygen Therapy: Patient Spontanous Breathing and Patient connected to face mask oxygen  Post-op Assessment: Report given to RN and Post -op Vital signs reviewed and stable  Post vital signs: Reviewed and stable  Last Vitals:  Vitals Value Taken Time  BP 147/85 12/01/19 1236  Temp    Pulse 66 12/01/19 1240  Resp 12 12/01/19 1240  SpO2 100 % 12/01/19 1240  Vitals shown include unvalidated device data.  Last Pain:  Vitals:   12/01/19 0831  TempSrc:   PainSc: 0-No pain         Complications: No complications documented.

## 2019-12-01 NOTE — Progress Notes (Signed)
Patient ID: Jason Bauer, male   DOB: 09-20-1945, 74 y.o.   MRN: [id number]  Post-op note  Subjective: The patient is doing well.  No complaints.  Pain controlled with medication.  Objective: Vital signs in last 24 hours: Temp:  [97.7 F (36.5 C)-98.6 F (37 C)] 97.7 F (36.5 C) (08/05 1429) Pulse Rate:  [66-132] 78 (08/05 1429) Resp:  [9-18] 14 (08/05 1429) BP: (128-167)/(64-85) 167/75 (08/05 1429) SpO2:  [91 %-100 %] 99 % (08/05 1429) Weight:  [81.6 kg] 81.6 kg (08/05 0834)  Intake/Output from previous day: No intake/output data recorded. Intake/Output this shift: Total I/O In: 1850 [I.V.:1750; IV Piggyback:100] Out: 300 [Urine:200; Blood:100]  Physical Exam:  General: Alert and oriented. Abdomen: Soft, Nondistended. Incisions: Clean and dry.  Lab Results: Recent Labs    12/01/19 1257  HGB 11.0*  HCT 34.4*    Assessment/Plan: POD#0   1) Continue to monitor, ambulate, IS   [name]. MD   LOS: 0 days   [name] 12/01/2019, 4:28 PM

## 2019-12-01 NOTE — Anesthesia Preprocedure Evaluation (Addendum)
Anesthesia Evaluation  Patient identified by MRN, date of birth, ID band Patient awake    Reviewed: Allergy & Precautions, NPO status , Patient's Chart, lab work & pertinent test results, reviewed documented beta blocker date and time   History of Anesthesia Complications Negative for: history of anesthetic complications  Airway Mallampati: II  TM Distance: >3 FB Neck ROM: Full    Dental  (+) Dental Advisory Given   Pulmonary neg pulmonary ROS,    Pulmonary exam normal        Cardiovascular hypertension, Pt. on medications and Pt. on home beta blockers (-) angina+ CAD and + Cardiac Stents  Normal cardiovascular exam     Neuro/Psych PSYCHIATRIC DISORDERS Anxiety Depression  PTSD negative neurological ROS     GI/Hepatic Neg liver ROS, hiatal hernia, GERD  Controlled and Medicated,  Endo/Other  Hypothyroidism   Renal/GU CRFRenal disease (left renal neoplasm)     Musculoskeletal  (+) Arthritis ,   Abdominal   Peds  Hematology  (+) anemia ,  On plavix    Anesthesia Other Findings Covid test negative Oropharyngeal carcinoma   Reproductive/Obstetrics                            Anesthesia Physical Anesthesia Plan  ASA: III  Anesthesia Plan: General   Post-op Pain Management:    Induction: Intravenous  PONV Risk Score and Plan: 3 and Treatment may vary due to age or medical condition, Ondansetron and Dexamethasone  Airway Management Planned: Oral ETT  Additional Equipment: None  Intra-op Plan:   Post-operative Plan: Extubation in OR  Informed Consent: I have reviewed the patients History and Physical, chart, labs and discussed the procedure including the risks, benefits and alternatives for the proposed anesthesia with the patient or authorized representative who has indicated his/her understanding and acceptance.     Dental advisory given  Plan Discussed with: CRNA and  Anesthesiologist  Anesthesia Plan Comments:        Anesthesia Quick Evaluation

## 2019-12-01 NOTE — Anesthesia Procedure Notes (Signed)
Procedure Name: Intubation Date/Time: 12/01/2019 9:11 AM Performed by: West Pugh, CRNA Pre-anesthesia Checklist: Patient identified, Emergency Drugs available, Suction available, Patient being monitored and Timeout performed Patient Re-evaluated:Patient Re-evaluated prior to induction Oxygen Delivery Method: Circle system utilized Preoxygenation: Pre-oxygenation with 100% oxygen Induction Type: IV induction Ventilation: Mask ventilation without difficulty Laryngoscope Size: Mac and 3 Grade View: Grade I Tube type: Oral Tube size: 7.0 mm Number of attempts: 1 Airway Equipment and Method: Stylet Placement Confirmation: ETT inserted through vocal cords under direct vision,  positive ETCO2 and breath sounds checked- equal and bilateral Secured at: 21 cm Tube secured with: Tape Dental Injury: Teeth and Oropharynx as per pre-operative assessment

## 2019-12-01 NOTE — Op Note (Signed)
Preoperative diagnosis: Left renal neoplasm  Postoperative diagnosis: Left renal neoplasm  Procedure: 1.  Left laparoscopic radical nephrectomy  Surgeon: [name]. M.D.  Assistant(s): Debbrah Alar, PA-C  An assistant was required for this surgical procedure.  The duties of the assistant included but were not limited to suctioning, passing suture, camera manipulation, retraction. This procedure would not be able to be performed without an Environmental consultant.  Resident: Dr. Carmie Kanner  Anesthesia: General  Complications: None  EBL: 100 mL  IVF:  1400 mL crystalloid  Specimens: 1. Left kidney  Disposition of specimens: Pathology  Indication: Jason Bauer is a 74 y.o. patient with a left renal tumor suspicious for malignancy.  After a thorough review of the management options for their renal mass, they elected to proceed with surgical treatment and the above procedure.  We have discussed the potential benefits and risks of the procedure, side effects of the proposed treatment, the likelihood of the patient achieving the goals of the procedure, and any potential problems that might occur during the procedure or recuperation. Informed consent has been obtained.  Description of procedure:  The patient was taken to the operating room and a general anesthetic was administered. The patient was given preoperative antibiotics, placed in the left modified flank position, and prepped and draped in the usual sterile fashion. Next a preoperative timeout was performed.  A site was selected near the umbilicus for placement of the camera port. This was placed using a standard open Hassan technique which allowed entry into the peritoneal cavity under direct vision and without difficulty. A 12 mm Hassan cannula was placed and a pneumoperitoneum established. The camera was then used to inspect the abdomen and there was no evidence of any intra-abdominal injuries or other abnormalities. The  remaining abdominal ports were then placed. A 12 mm port was placed in the left lower quadrant and a 5 mm port was placed in the left upper quadrant.  All ports were placed under direct vision without difficulty.  Utilizing the harmonic scalpel, the white line of Toldt was incised allowing the colon to be reflected medially and the plane between the mesocolon and the anterior layer of Gerota's fascia to be developed and the kidney exposed.  The ureter and gonadal vein were identified inferiorly and the ureter was lifted anteriorly off the psoas muscle.  Dissection proceeded superiorly along the gonadal vein until the renal vein was identified.  The renal hilum was then carefully isolated with a combination of blunt and sharp dissectiong allowing the renal arterial and venous structures to be separated and isolated.   The renal artery was isolated and ligated with multiple Weck clips and subsequently divided.  The renal vein was then isolated and also ligated and divided with a 45 mm Flex ETS stapler.  Gerota's fascia was intentionally entered superiorly and the space between the adrenal gland and the kidney was developed allowing the adrenal gland to be spared.  The splenorenal ligaments were divided with the harmonic scalpel.  The lateral and posterior attachements to the kidney were then divided.  The ureter was ligated with Weck clips and divided allowing the specimen to be freed from all surrounding structures.  The kidney specimen was then placed into a 12 mm retrieval bag.  The renal hilum, liver, adrenal bed and gonadal vein areas were each inspected and hemostasis was ensured with the pneomperitoneal pressures lowered.  The 12 mm lower quadrant port was then closed with a 0-vicryl suture placed laparoscopically  to close the fascia of this incision. All remaining ports were removed under direct vision.  The kidney specimen was removed intact within the retrieval bag via the camera port site after this  incision was extended slightly. This fascial opening was then closed with two #1 PDS sutures.    All incisions were injected with local anesthetic and reapproximated at the skin with 4-0 monocryl sutures.  Dermabond was applied to the skin. The patient tolerated the procedure well and without complications and was transferred to the recovery unit in satisfactory condition.   [name] MD

## 2019-12-01 NOTE — Progress Notes (Signed)
Post-op note  Subjective: The patient is doing well.  Endorses mild soreness but otherwise no complaints. Tolerating clears.   Objective: Vital signs in last 24 hours: Temp:  [97.7 F (36.5 C)-98.6 F (37 C)] 97.7 F (36.5 C) (08/05 1429) Pulse Rate:  [66-132] 78 (08/05 1429) Resp:  [9-18] 14 (08/05 1429) BP: (128-167)/(64-85) 167/75 (08/05 1429) SpO2:  [91 %-100 %] 99 % (08/05 1429) Weight:  [81.6 kg] 81.6 kg (08/05 0834)  Intake/Output from previous day: No intake/output data recorded. Intake/Output this shift: Total I/O In: 1850 [I.V.:1750; IV Piggyback:100] Out: 300 [Urine:200; Blood:100]  Physical Exam:  General: Alert and oriented. Abdomen: Soft, Nondistended. Incisions: Clean and dry.  Lab Results: Recent Labs    12/01/19 1257  HGB 11.0*  HCT 34.4*    Assessment/Plan: POD#0 s/p laparoscopic left radical nephrectomy  1) Continue to monitor 2) Continue clears, IVF, foley catheter 3) PRN pain medication    LOS: 0 days   Carmie Kanner 12/01/2019, 4:17 PM

## 2019-12-01 NOTE — Anesthesia Postprocedure Evaluation (Signed)
Anesthesia Post Note  Patient: Jason Bauer  Procedure(s) Performed: LAPAROSCOPIC NEPHRECTOMY (Left )     Patient location during evaluation: PACU Anesthesia Type: General Level of consciousness: awake and alert Pain management: pain level controlled Vital Signs Assessment: post-procedure vital signs reviewed and stable Respiratory status: spontaneous breathing, nonlabored ventilation, respiratory function stable and patient connected to nasal cannula oxygen Cardiovascular status: blood pressure returned to baseline and stable Postop Assessment: no apparent nausea or vomiting Anesthetic complications: no   No complications documented.  Last Vitals:  Vitals:   12/01/19 1315 12/01/19 1330  BP: 133/66 138/66  Pulse: 67 66  Resp: 14 11  Temp:    SpO2: 100% 100%    Last Pain:  Vitals:   12/01/19 1330  TempSrc:   PainSc: 0-No pain                 Audry Pili

## 2019-12-01 NOTE — Interval H&P Note (Signed)
History and Physical Interval Note:  12/01/2019 8:32 AM  Jason Bauer  has presented today for surgery, with the diagnosis of LEFT RENAL NEOPLASM.  The various methods of treatment have been discussed with the patient and family. After consideration of risks, benefits and other options for treatment, the patient has consented to  Procedure(s) with comments: LAPAROSCOPIC NEPHRECTOMY (Left) - ONLY NEEDS 160 MIN as a surgical intervention.  The patient's history has been reviewed, patient examined, no change in status, stable for surgery.  I have reviewed the patient's chart and labs.  Questions were answered to the patient's satisfaction.     [employer]

## 2019-12-01 NOTE — Discharge Instructions (Signed)
1.  Activity:  You are encouraged to ambulate frequently (about every hour during waking hours) to help prevent blood clots from forming in your legs or lungs.  However, you should not engage in any heavy lifting (> 10-15 lbs), strenuous activity, or straining. 2. Diet: You should advance your diet as instructed by your physician.  It will be normal to have some bloating, nausea, and abdominal discomfort intermittently. 3. Prescriptions:  You will be provided a prescription for pain medication to take as needed.  If your pain is not severe enough to require the prescription pain medication, you may take extra strength Tylenol instead which will have less side effects.  You should also take a prescribed stool softener to avoid straining with bowel movements as the prescription pain medication may constipate you. 4. Incisions: You may remove your dressing bandages 48 hours after surgery if not removed in the hospital.  You will either have some small staples or special tissue glue at each of the incision sites. Once the bandages are removed (if present), the incisions may stay open to air.  You may start showering (but not soaking or bathing in water) the 2nd day after surgery and the incisions simply need to be patted dry after the shower.  No additional care is needed. 5. What to call us about: You should call the office ([phone]) if you develop fever > 101 or develop persistent vomiting.   You may resume advil, aleve, vitamins, and supplements 7 days after surgery. 

## 2019-12-02 ENCOUNTER — Encounter (HOSPITAL_COMMUNITY): Payer: Self-pay | Admitting: Urology

## 2019-12-02 LAB — BASIC METABOLIC PANEL
Anion gap: 8 (ref 5–15)
BUN: 28 mg/dL — ABNORMAL HIGH (ref 8–23)
CO2: 25 mmol/L (ref 22–32)
Calcium: 8.4 mg/dL — ABNORMAL LOW (ref 8.9–10.3)
Chloride: 98 mmol/L (ref 98–111)
Creatinine, Ser: 1.51 mg/dL — ABNORMAL HIGH (ref 0.61–1.24)
GFR calc Af Amer: 52 mL/min — ABNORMAL LOW (ref >60)
GFR calc non Af Amer: 45 mL/min — ABNORMAL LOW (ref >60)
Glucose, Bld: 273 mg/dL — ABNORMAL HIGH (ref 70–99)
Potassium: 4.5 mmol/L (ref 3.5–5.1)
Sodium: 131 mmol/L — ABNORMAL LOW (ref 135–145)

## 2019-12-02 LAB — HEMOGLOBIN AND HEMATOCRIT, BLOOD
HCT: 31.2 % — ABNORMAL LOW (ref 39.0–52.0)
Hemoglobin: 10.2 g/dL — ABNORMAL LOW (ref 13.0–17.0)

## 2019-12-02 MED ORDER — DOCUSATE SODIUM 100 MG PO CAPS
100.0000 mg | ORAL_CAPSULE | Freq: Two times a day (BID) | ORAL | 0 refills | Status: AC
Start: 2019-12-02 — End: 2020-01-01

## 2019-12-02 MED ORDER — CHLORHEXIDINE GLUCONATE CLOTH 2 % EX PADS
6.0000 | MEDICATED_PAD | Freq: Every day | CUTANEOUS | Status: DC
  Administered 2019-12-02: 6 via TOPICAL

## 2019-12-02 MED ORDER — BISACODYL 10 MG RE SUPP
10.0000 mg | Freq: Once | RECTAL | Status: AC
  Administered 2019-12-02: 10 mg via RECTAL
  Filled 2019-12-02: qty 1

## 2019-12-02 MED ORDER — TRAMADOL HCL 50 MG PO TABS: 50.0000 mg | ORAL_TABLET | Freq: Four times a day (QID) | ORAL | Status: DC | PRN

## 2019-12-02 NOTE — Progress Notes (Signed)
Progress note  Subjective: The patient is doing well. Ambulating multiple times last night. Tolerating clears. Denies pain. Slept well. No flatus or BM.   Objective: Vital signs in last 24 hours: Temp:  [96.5 F (35.8 C)-98.6 F (37 C)] 96.5 F (35.8 C) (08/06 0611) Pulse Rate:  [55-132] 55 (08/06 0611) Resp:  [9-18] 15 (08/06 0611) BP: (110-167)/(58-85) 123/59 (08/06 0611) SpO2:  [91 %-100 %] 100 % (08/06 0611) Weight:  [81.6 kg] 81.6 kg (08/05 0834)  Intake/Output from previous day: 08/05 0701 - 08/06 0700 In: 5140.7 [P.O.:1140; I.V.:3639.6; IV Piggyback:361.2] Out: 1657 [XUXYB:3383; Blood:100] Intake/Output this shift: No intake/output data recorded.  Physical Exam:  General: Alert and oriented. Abdomen: Soft, Nondistended. Incisions: Clean and dry.  Lab Results: Recent Labs    12/01/19 1257 12/02/19 0458  HGB 11.0* 10.2*  HCT 34.4* 31.2*    Assessment/Plan: POD#1 s/p laparoscopic left radical nephrectomy, recovering well  1) SL IVF  2) D/C foley, TOV 3) Stop IV pain medications 4) Continue clears 5) Likely d/c later today     LOS: 1 day   Carmie Kanner 12/02/2019, 7:24 AM

## 2019-12-02 NOTE — Discharge Summary (Signed)
[facility] Discharge Summary  Admit date: 12/01/2019  Discharge date and time: 12/02/19   Discharge to: Home  Discharge Service: Urology  Discharge Attending Physician:  Dr Raynelle Bring, Brooke Bonito  Discharge  Diagnoses: Left renal mass  Secondary Diagnosis: Active Problems:   Neoplasm of left kidney   OR Procedures: Procedure(s): LAPAROSCOPIC NEPHRECTOMY 12/01/2019   Ancillary Procedures: None   Discharge Day Services: The patient was seen and examined by the Urology team both in the morning and immediately prior to discharge.  Vital signs and laboratory values were stable and within normal limits.  The physical exam was benign and unchanged and all surgical wounds were examined.  Discharge instructions were explained and all questions answered.  Subjective  No acute events overnight. Pain Controlled. No fever or chills.  Objective Patient Vitals for the past 8 hrs:  BP Temp Temp src Pulse Resp SpO2  12/02/19 0859 140/67 97.9 F (36.6 C) Oral (!) 54 11 100 %  12/02/19 0759 125/60 -- -- 62 -- --  12/02/19 0611 (!) 123/59 (!) 96.5 F (35.8 C) Axillary (!) 55 15 100 %   Total I/O In: 480 [P.O.:480] Out: 675 [Urine:675]  General Appearance:        No acute distress Lungs:                       Normal work of breathing on room air Heart:                                Regular rate and rhythm Abdomen:                         Soft, non-tender, non-distended Extremities:                      Warm and well perfused   Hospital Course:  The patient underwent left laparoscopic radical nephrectomy on 12/01/2019.  The patient tolerated the procedure well, was extubated in the OR, and afterwards was taken to the PACU for routine post-surgical care. When stable the patient was transferred to the floor.   The patient did well postoperatively.  The patient's catheter was removed on POD1 and he was able to void spontaneously.The patient was discharged home 1 Day Post-Op, at which point was  tolerating a clear liquid diet, was able to void spontaneously, have adequate pain control with P.O. pain medication, and could ambulate without difficulty. He had a bowel movement prior to discharge. The patient will follow up with [place] for post op check.   Condition at Discharge: Improved  Discharge Medications:  Allergies as of 12/02/2019   No Known Allergies     Medication List    STOP taking these medications   Allergy Eye Drops 0.025 % ophthalmic solution Generic drug: ketotifen   Co Q 10 100 MG Caps   Fish Oil 1000 MG Caps   vitamin B-12 500 MCG tablet Commonly known as: CYANOCOBALAMIN   Vitamin D3 10 MCG (400 UNIT) tablet     TAKE these medications   ALPRAZolam 0.5 MG tablet Commonly known as: XANAX Take 0.5 mg by mouth at bedtime.   aspirin EC 81 MG tablet Take 81 mg by mouth at bedtime.   atorvastatin 10 MG tablet Commonly known as: LIPITOR Take 10 mg by mouth at bedtime.   carvedilol 12.5 MG tablet Commonly known as: COREG  Take 6.25 mg by mouth 2 (two) times daily with a meal.   clopidogrel 75 MG tablet Commonly known as: PLAVIX Take 75 mg by mouth daily.   docusate sodium 100 MG capsule Commonly known as: Colace Take 1 capsule (100 mg total) by mouth 2 (two) times daily.   JUICE PLUS FIBRE PO Take 4 tablets by mouth at bedtime. Fruit (2) + Vegetable (2)   levothyroxine 112 MCG tablet Commonly known as: SYNTHROID Take 112 mcg by mouth daily before breakfast.   lisinopril 20 MG tablet Commonly known as: ZESTRIL Take 1 tablet (20 mg total) by mouth 2 (two) times daily.   Magnesium Oxide 420 MG Tabs Take 840 mg by mouth at bedtime.   pantoprazole 40 MG tablet Commonly known as: PROTONIX Take 40 mg by mouth daily before breakfast.   phenazopyridine 200 MG tablet Commonly known as: Pyridium Take 1 tablet (200 mg total) by mouth 3 (three) times daily as needed for pain.   sertraline 50 MG tablet Commonly known as: ZOLOFT Take 25 mg by mouth in  the morning and at bedtime.   traMADol 50 MG tablet Commonly known as: Ultram Take 1-2 tablets (50-100 mg total) by mouth every 6 (six) hours as needed for moderate pain or severe pain.

## 2019-12-02 NOTE — Progress Notes (Signed)
Pt to be discharged to home this afternoon. Discharge instructions including Medications and schedules for these Medications reviewed with the Pt. Pt verbalized understanding of al discharge teaching/instructions. Discharge packet with Pt at time of discharge

## 2019-12-05 LAB — SURGICAL PATHOLOGY

## 2019-12-06 ENCOUNTER — Telehealth: Payer: Self-pay | Admitting: Cardiology

## 2019-12-06 ENCOUNTER — Other Ambulatory Visit: Payer: Self-pay

## 2019-12-06 MED ORDER — CLOPIDOGREL BISULFATE 75 MG PO TABS: 75.0000 mg | ORAL_TABLET | Freq: Every day | ORAL | 3 refills | Status: DC

## 2019-12-06 NOTE — Telephone Encounter (Signed)
     Pt c/o medication issue:  1. Name of Medication: Clopidogrel   2. How are you currently taking this medication (dosage and times per day)?   3. Are you having a reaction (difficulty breathing--STAT)?  4. What is your medication issue? Baley from [facility] called, she said pt went there and was told by DR. Munley he will call in Clopidogrel for him, they weren't sure what dosage.

## 2019-12-27 DIAGNOSIS — C642 Malignant neoplasm of left kidney, except renal pelvis: Secondary | ICD-10-CM | POA: Diagnosis not present

## 2020-01-20 ENCOUNTER — Ambulatory Visit (INDEPENDENT_AMBULATORY_CARE_PROVIDER_SITE_OTHER): Payer: Medicare Other | Admitting: Cardiology

## 2020-01-20 ENCOUNTER — Encounter: Payer: Self-pay | Admitting: Cardiology

## 2020-01-20 ENCOUNTER — Other Ambulatory Visit: Payer: Self-pay

## 2020-01-20 VITALS — BP 190/84 | HR 52 | Ht 70.0 in | Wt 180.0 lb

## 2020-01-20 DIAGNOSIS — E782 Mixed hyperlipidemia: Secondary | ICD-10-CM | POA: Diagnosis not present

## 2020-01-20 DIAGNOSIS — I25118 Atherosclerotic heart disease of native coronary artery with other forms of angina pectoris: Secondary | ICD-10-CM

## 2020-01-20 DIAGNOSIS — I10 Essential (primary) hypertension: Secondary | ICD-10-CM | POA: Diagnosis not present

## 2020-01-20 NOTE — Progress Notes (Signed)
Cardiology Office Note:    Date:  01/20/2020   ID:  Jason Bauer, DOB July 14, 1945, MRN [id number]  PCP:  Street, Sharon Mt, MD  Cardiologist:  Shirlee More, MD    Referring MD: [address] Sharon Mt, *    ASSESSMENT:    1. Coronary artery disease of native artery of native heart with stable angina pectoris ([place])   2. Essential hypertension   3. Mixed hyperlipidemia    PLAN:    In order of problems listed above:  In general he is done well his CAD is stable continue medical therapy including long-term dual antiplatelet therapy interrupted for his urologic surgery beta-blocker and lipid-lowering.  He is having no angina at this time would not advise an ischemia evaluation Labile hypertension influenced by surgery general anesthesia and if need be he will hold his ACE inhibitor for systolics of less than 683 Lipids at target recent labs from the [facility] hospital no muscle pain or weakness  Next appointment: 6 months   Medication Adjustments/Labs and Tests Ordered: Current medicines are reviewed at length with the patient today.  Concerns regarding medicines are outlined above.  No orders of the defined types were placed in this encounter.  No orders of the defined types were placed in this encounter.   Chief Complaint  Patient presents with  . Follow-up  . Coronary Artery Disease    History of Present Illness:    Jason Bauer is a 74 y.o. male with a hx of  CAD PCI and stent left anterior descending artery in 2003 hypertension and hyperlipidemia  last seen 06/23/2019.  He underwent laparoscopic radical nephrectomy 12/01/2019 for renal cancer.  Compliance with diet, lifestyle and medications: Yes  Is encouraged by the results of the surgery for renal cancer.  He will be under observation by his urologist.  His blood pressure has been labile has had symptomatic hypotension with systolics of less than 419 and I will have him hold ACE inhibitor at home if he gets a  systolic of less than 622.  He has a great deal of anxiety but no chest pain shortness of breath palpitation or syncope.  Recent labs reviewed from the [place] hospital was lipids to be at target.  EKG performed during that hospitalization independently reviewed by me 11/16/2019 sinus bradycardia 48 bpm right bundle branch block. Past Medical History:  Diagnosis Date  . Anginal pain ([place])   . Anxiety   . Arthritis   . Carcinoma of oropharynx ([place]) 07/30/2011  . Chronic kidney disease   . Coronary artery disease involving native coronary artery of native heart 08/01/2015   PCI and stent LAD 2003  . Depression   . Diverticulosis   . Essential hypertension 08/01/2015  . GERD (gastroesophageal reflux disease)   . Hiatal hernia   . Hyperlipidemia 08/01/2015  . Hypothyroidism   . Preoperative cardiovascular examination 08/01/2015  . PTSD (post-traumatic stress disorder)     Past Surgical History:  Procedure Laterality Date  . ANAL FISTULOTOMY    . ARTHROSCOPY KNEE W/ DRILLING Left   . CARDIAC CATHETERIZATION  2003  . CORONARY ANGIOPLASTY    . CYSTOSCOPY/URETEROSCOPY/HOLMIUM LASER/STENT PLACEMENT Left 11/24/2019   Procedure: CYSTOSCOPY/RETROGRADE/URETEROSCOPY/STENT PLACEMENT;  Surgeon: Raynelle Bring, MD;  Location: [facility];  Service: Urology;  Laterality: Left;  . HEMORRHOID SURGERY    . LAPAROSCOPIC NEPHRECTOMY Left 12/01/2019   Procedure: LAPAROSCOPIC NEPHRECTOMY;  Surgeon: Raynelle Bring, MD;  Location: [facility];  Service: Urology;  Laterality: Left;  ONLY NEEDS 160  MIN  . TONSILLECTOMY Left 2006    Current Medications: Current Meds  Medication Sig  . ALPRAZolam (XANAX) 0.5 MG tablet Take 0.5 mg by mouth at bedtime.   Marland Kitchen aspirin EC 81 MG tablet Take 81 mg by mouth at bedtime.   Marland Kitchen atorvastatin (LIPITOR) 10 MG tablet Take 10 mg by mouth at bedtime.   . carvedilol (COREG) 12.5 MG tablet Take 6.25 mg by mouth 2 (two) times daily with a meal.  . cholecalciferol (D3 HIGH POTENCY) 10 MCG (400 UNIT) TABS  tablet Take 400 Units by mouth daily.  . clopidogrel (PLAVIX) 75 MG tablet Take 1 tablet (75 mg total) by mouth daily.  . Coenzyme Q10 (CO Q 10) 100 MG CAPS Take 100 mg by mouth daily.  Marland Kitchen levothyroxine (SYNTHROID) 125 MCG tablet Take 125 mcg by mouth daily before breakfast.  . lisinopril (ZESTRIL) 20 MG tablet Take 1 tablet (20 mg total) by mouth 2 (two) times daily.  . Magnesium Oxide 420 MG TABS Take 840 mg by mouth at bedtime.   . Nutritional Supplements (JUICE PLUS FIBRE PO) Take 4 tablets by mouth at bedtime. Fruit (2) + Vegetable (2)  . Omega-3 Fatty Acids (FISH OIL) 1000 MG CAPS Take 1,000 mg by mouth daily.  . pantoprazole (PROTONIX) 40 MG tablet Take 40 mg by mouth daily before breakfast.   . sertraline (ZOLOFT) 50 MG tablet Take 75 mg by mouth daily. Takes 25 mg in am and 50 mg at night  . vitamin B-12 (CYANOCOBALAMIN) 500 MCG tablet Take 500 mcg by mouth daily.     Allergies:   Patient has no known allergies.   Social History   Socioeconomic History  . Marital status: Married    Spouse name: Not on file  . Number of children: Not on file  . Years of education: Not on file  . Highest education level: Not on file  Occupational History  . Not on file  Tobacco Use  . Smoking status: Never Smoker  . Smokeless tobacco: Never Used  Vaping Use  . Vaping Use: Never used  Substance and Sexual Activity  . Alcohol use: Not Currently  . Drug use: Never  . Sexual activity: Yes  Other Topics Concern  . Not on file  Social History Narrative  . Not on file   Social Determinants of Health   Financial Resource Strain:   . Difficulty of Paying Living Expenses: Not on file  Food Insecurity:   . Worried About Charity fundraiser in the Last Year: Not on file  . Ran Out of Food in the Last Year: Not on file  Transportation Needs:   . Lack of Transportation (Medical): Not on file  . Lack of Transportation (Non-Medical): Not on file  Physical Activity:   . Days of Exercise per  Week: Not on file  . Minutes of Exercise per Session: Not on file  Stress:   . Feeling of Stress : Not on file  Social Connections:   . Frequency of Communication with Friends and Family: Not on file  . Frequency of Social Gatherings with Friends and Family: Not on file  . Attends Religious Services: Not on file  . Active Member of Clubs or Organizations: Not on file  . Attends Archivist Meetings: Not on file  . Marital Status: Not on file     Family History: The patient's family history includes Bladder Cancer in his father; CAD in his father; Cancer in his father; Diabetes  in his paternal grandfather; Hypertension in his father; Scoliosis in his mother. ROS:   Please see the history of present illness.    All other systems reviewed and are negative.  EKGs/Labs/Other Studies Reviewed:    The following studies were reviewed today:   Recent Labs: 06/23/2019: ALT 19 11/28/2019: Platelets 283 12/02/2019: BUN 28; Creatinine, Ser 1.51; Hemoglobin 10.2; Potassium 4.5; Sodium 131  Recent Lipid Panel    Component Value Date/Time   CHOL 148 06/23/2019 1106   TRIG 110 06/23/2019 1106   HDL 57 06/23/2019 1106   CHOLHDL 2.6 06/23/2019 1106   LDLCALC 71 06/23/2019 1106    Physical Exam:    VS:  BP (!) 190/84 (BP Location: Right Arm, Patient Position: Sitting, Cuff Size: Normal)   Pulse (!) 52   Ht 5\' 10"  (1.778 m)   Wt 180 lb (81.6 kg)   SpO2 99%   BMI 25.83 kg/m     Wt Readings from Last 3 Encounters:  01/20/20 180 lb (81.6 kg)  12/01/19 180 lb (81.6 kg)  11/28/19 180 lb (81.6 kg)    Pete blood pressure by me 148/80 GEN:  Well nourished, well developed in no acute distress HEENT: Normal NECK: No JVD; No carotid bruits LYMPHATICS: No lymphadenopathy CARDIAC: RRR, no murmurs, rubs, gallops RESPIRATORY:  Clear to auscultation without rales, wheezing or rhonchi  ABDOMEN: Soft, non-tender, non-distended MUSCULOSKELETAL:  No edema; No deformity  SKIN: Warm and  dry NEUROLOGIC:  Alert and oriented x 3 PSYCHIATRIC:  Normal affect    Signed, Shirlee More, MD  01/20/2020 10:54 AM    [place]

## 2020-01-20 NOTE — Patient Instructions (Signed)
  Medication Instructions:  Your physician has recommended you make the following change in your medication:  IF Blood pressure systolic lower than 706 hold lisinopril   *If you need a refill on your cardiac medications before your next appointment, please call your pharmacy*   Lab Work: None.  If you have labs (blood work) drawn today and your tests are completely normal, you will receive your results only by: Marland Kitchen MyChart Message (if you have MyChart) OR . A paper copy in the mail If you have any lab test that is abnormal or we need to change your treatment, we will call you to review the results.   Testing/Procedures: None   Follow-Up: At [facility], you and your health needs are our priority.  As part of our continuing mission to provide you with exceptional heart care, we have created designated Provider Care Teams.  These Care Teams include your primary Cardiologist (physician) and Advanced Practice Providers (APPs -  Physician Assistants and Nurse Practitioners) who all work together to provide you with the care you need, when you need it.  We recommend signing up for the patient portal called "MyChart".  Sign up information is provided on this After Visit Summary.  MyChart is used to connect with patients for Virtual Visits (Telemedicine).  Patients are able to view lab/test results, encounter notes, upcoming appointments, etc.  Non-urgent messages can be sent to your provider as well.   To learn more about what you can do with MyChart, go to [email or web address].    Your next appointment:   3 month(s)  The format for your next appointment:   In Person  Provider:   Shirlee More, MD   Other Instructions

## 2020-01-26 ENCOUNTER — Ambulatory Visit (INDEPENDENT_AMBULATORY_CARE_PROVIDER_SITE_OTHER): Payer: Medicare Other

## 2020-01-26 ENCOUNTER — Other Ambulatory Visit: Payer: Self-pay

## 2020-01-26 DIAGNOSIS — Z23 Encounter for immunization: Secondary | ICD-10-CM

## 2020-01-26 NOTE — Progress Notes (Signed)
   Covid-19 Vaccination Clinic  Name:  Jason Bauer    MRN: [id number] DOB: 10-Feb-1946  01/26/2020  Jason Bauer was observed post Covid-19 immunization for 15 minutes without incident. He was provided with Vaccine Information Sheet and instruction to access the V-Safe system.   Jason Bauer was instructed to call 911 with any severe reactions post vaccine: Marland Kitchen Difficulty breathing  . Swelling of face and throat  . A fast heartbeat  . A bad rash all over body  . Dizziness and weakness

## 2020-02-09 DIAGNOSIS — Z23 Encounter for immunization: Secondary | ICD-10-CM | POA: Diagnosis not present

## 2020-02-16 ENCOUNTER — Other Ambulatory Visit: Payer: Self-pay | Admitting: Cardiology

## 2020-04-09 DIAGNOSIS — L578 Other skin changes due to chronic exposure to nonionizing radiation: Secondary | ICD-10-CM | POA: Diagnosis not present

## 2020-04-09 DIAGNOSIS — C44729 Squamous cell carcinoma of skin of left lower limb, including hip: Secondary | ICD-10-CM | POA: Diagnosis not present

## 2020-04-09 DIAGNOSIS — L820 Inflamed seborrheic keratosis: Secondary | ICD-10-CM | POA: Diagnosis not present

## 2020-04-09 DIAGNOSIS — L821 Other seborrheic keratosis: Secondary | ICD-10-CM | POA: Diagnosis not present

## 2020-04-11 NOTE — Progress Notes (Signed)
Cardiology Office Note:    Date:  04/12/2020   ID:  Jason Bauer, DOB 04-20-1946, MRN [id number]  PCP:  Street, Sharon Mt, MD  Cardiologist:  Shirlee More, MD    Referring MD: [address], Sharon Mt, *    ASSESSMENT:    1. Muscle pain   2. Essential hypertension   3. Mixed hyperlipidemia   4. Coronary artery disease of native artery of native heart with stable angina pectoris ([place])    PLAN:    In order of problems listed above:  1. Predominant problem today is muscle pain related to his high intensity statin plan is withdrawal 4 weeks and switch to rosuvastatin.  Much lower incidence of muscle symptoms. 2. Well-controlled we will continue to monitor blood pressure at home and continue his current medical regimen 3. Recheck labs today also do an aldolase plan as above 4. Stable CAD has done well with medical therapy including aspirin statin and antihypertensive.   Next appointment: 6 months   Medication Adjustments/Labs and Tests Ordered: Current medicines are reviewed at length with the patient today.  Concerns regarding medicines are outlined above.  No orders of the defined types were placed in this encounter.  No orders of the defined types were placed in this encounter.   Chief Complaint  Patient presents with  . Follow-up  . Hypertension    History of Present Illness:    Jason Bauer is a 74 y.o. male with a hx of coronary artery disease with PCI and stent to left anterior descending coronary artery 2003 hypertension branch block and hyper lipidemia.  Most recently underwent laparoscopic radical nephrectomy 12/01/2019 for surgery.  He was last seen 01/20/2020 with BP above target. . Compliance with diet, lifestyle and medications: Yes  Previous labile hypertension is resolved blood pressures predictably 1/20-1300 70-80. He has recovered from his surgery but has more trouble with diffuse muscle aching thinks it is related to a atorvastatin will  reduce take a 4-hiatus and then switch to rosuvastatin if his muscle symptoms resolve No chest pain edema shortness of breath palpitation or syncope Past Medical History:  Diagnosis Date  . Anginal pain ([place])   . Anxiety   . Arthritis   . Carcinoma of oropharynx ([place]) 07/30/2011  . Chronic kidney disease   . Coronary artery disease involving native coronary artery of native heart 08/01/2015   PCI and stent LAD 2003  . Depression   . Diverticulosis   . Essential hypertension 08/01/2015  . GERD (gastroesophageal reflux disease)   . Hiatal hernia   . Hyperlipidemia 08/01/2015  . Hypothyroidism   . Preoperative cardiovascular examination 08/01/2015  . PTSD (post-traumatic stress disorder)     Past Surgical History:  Procedure Laterality Date  . ANAL FISTULOTOMY    . ARTHROSCOPY KNEE W/ DRILLING Left   . CARDIAC CATHETERIZATION  2003  . CORONARY ANGIOPLASTY    . CYSTOSCOPY/URETEROSCOPY/HOLMIUM LASER/STENT PLACEMENT Left 11/24/2019   Procedure: CYSTOSCOPY/RETROGRADE/URETEROSCOPY/STENT PLACEMENT;  Surgeon: Raynelle Bring, MD;  Location: [facility];  Service: Urology;  Laterality: Left;  . HEMORRHOID SURGERY    . LAPAROSCOPIC NEPHRECTOMY Left 12/01/2019   Procedure: LAPAROSCOPIC NEPHRECTOMY;  Surgeon: Raynelle Bring, MD;  Location: [facility];  Service: Urology;  Laterality: Left;  ONLY NEEDS 160 MIN  . TONSILLECTOMY Left 2006    Current Medications: Current Meds  Medication Sig  . ALPRAZolam (XANAX) 0.5 MG tablet Take 0.5 mg by mouth at bedtime.   Marland Kitchen aspirin EC 81 MG tablet Take 81 mg by  mouth at bedtime.   Marland Kitchen atorvastatin (LIPITOR) 10 MG tablet Take 10 mg by mouth at bedtime.   . carvedilol (COREG) 12.5 MG tablet Take 6.25 mg by mouth 2 (two) times daily with a meal.  . cholecalciferol (VITAMIN D3) 10 MCG (400 UNIT) TABS tablet Take 400 Units by mouth daily.  . clopidogrel (PLAVIX) 75 MG tablet Take 1 tablet (75 mg total) by mouth daily.  . Coenzyme Q10 (CO Q 10) 100 MG CAPS Take 100 mg by mouth  daily.  Marland Kitchen levothyroxine (SYNTHROID) 125 MCG tablet Take 125 mcg by mouth daily before breakfast.  . lisinopril (ZESTRIL) 20 MG tablet Take 1 tablet (20 mg total) by mouth 2 (two) times daily.  . Magnesium Oxide 420 MG TABS Take 840 mg by mouth at bedtime.   . Nutritional Supplements (JUICE PLUS FIBRE PO) Take 4 tablets by mouth at bedtime. Fruit (2) + Vegetable (2)  . Omega-3 Fatty Acids (FISH OIL) 1000 MG CAPS Take 1,000 mg by mouth daily.  . pantoprazole (PROTONIX) 40 MG tablet Take 40 mg by mouth daily before breakfast.   . sertraline (ZOLOFT) 50 MG tablet Take 75 mg by mouth daily. Takes 25 mg in am and 50 mg at night  . vitamin B-12 (CYANOCOBALAMIN) 500 MCG tablet Take 500 mcg by mouth daily.     Allergies:   Patient has no known allergies.   Social History   Socioeconomic History  . Marital status: Married    Spouse name: Not on file  . Number of children: Not on file  . Years of education: Not on file  . Highest education level: Not on file  Occupational History  . Not on file  Tobacco Use  . Smoking status: Never Smoker  . Smokeless tobacco: Never Used  Vaping Use  . Vaping Use: Never used  Substance and Sexual Activity  . Alcohol use: Not Currently  . Drug use: Never  . Sexual activity: Yes  Other Topics Concern  . Not on file  Social History Narrative  . Not on file   Social Determinants of Health   Financial Resource Strain: Not on file  Food Insecurity: Not on file  Transportation Needs: Not on file  Physical Activity: Not on file  Stress: Not on file  Social Connections: Not on file     Family History: The patient's family history includes Bladder Cancer in his father; CAD in his father; Cancer in his father; Diabetes in his paternal grandfather; Hypertension in his father; Scoliosis in his mother. ROS:   Please see the history of present illness.    All other systems reviewed and are negative.  EKGs/Labs/Other Studies Reviewed:    The following  studies were reviewed today:    Recent Labs: 06/23/2019: ALT 19 11/28/2019: Platelets 283 12/02/2019: BUN 28; Creatinine, Ser 1.51; Hemoglobin 10.2; Potassium 4.5; Sodium 131  Recent Lipid Panel    Component Value Date/Time   CHOL 148 06/23/2019 1106   TRIG 110 06/23/2019 1106   HDL 57 06/23/2019 1106   CHOLHDL 2.6 06/23/2019 1106   LDLCALC 71 06/23/2019 1106    Physical Exam:    VS:  BP (!) 142/78 (BP Location: Left Arm)   Pulse (!) 56   Ht 5\' 10"  (1.778 m)   Wt 182 lb 12.8 oz (82.9 kg)   SpO2 99%   BMI 26.23 kg/m     Wt Readings from Last 3 Encounters:  04/12/20 182 lb 12.8 oz (82.9 kg)  01/20/20 180 lb (  81.6 kg)  12/01/19 180 lb (81.6 kg)     GEN:  Well nourished, well developed in no acute distress HEENT: Normal NECK: No JVD; No carotid bruits LYMPHATICS: No lymphadenopathy CARDIAC: RRR, no murmurs, rubs, gallops RESPIRATORY:  Clear to auscultation without rales, wheezing or rhonchi  ABDOMEN: Soft, non-tender, non-distended MUSCULOSKELETAL:  No edema; No deformity  SKIN: Warm and dry NEUROLOGIC:  Alert and oriented x 3 PSYCHIATRIC:  Normal affect    Signed, Shirlee More, MD  04/12/2020 10:17 AM    [place]

## 2020-04-12 ENCOUNTER — Other Ambulatory Visit: Payer: Self-pay

## 2020-04-12 ENCOUNTER — Ambulatory Visit (INDEPENDENT_AMBULATORY_CARE_PROVIDER_SITE_OTHER): Payer: Medicare Other | Admitting: Cardiology

## 2020-04-12 ENCOUNTER — Encounter: Payer: Self-pay | Admitting: Cardiology

## 2020-04-12 VITALS — BP 126/80 | HR 56 | Ht 70.0 in | Wt 182.8 lb

## 2020-04-12 DIAGNOSIS — E782 Mixed hyperlipidemia: Secondary | ICD-10-CM | POA: Diagnosis not present

## 2020-04-12 DIAGNOSIS — M791 Myalgia, unspecified site: Secondary | ICD-10-CM

## 2020-04-12 DIAGNOSIS — I10 Essential (primary) hypertension: Secondary | ICD-10-CM | POA: Diagnosis not present

## 2020-04-12 DIAGNOSIS — I25118 Atherosclerotic heart disease of native coronary artery with other forms of angina pectoris: Secondary | ICD-10-CM | POA: Diagnosis not present

## 2020-04-12 MED ORDER — ROSUVASTATIN CALCIUM 5 MG PO TABS: 5.0000 mg | ORAL_TABLET | Freq: Every day | ORAL | 3 refills | Status: DC

## 2020-04-12 NOTE — Patient Instructions (Signed)
Medication Instructions:  Your physician has recommended you make the following change in your medication:  STOP: Atorvastatin START: After 4 weeks start Rosuvastatin 5 mg take one tablet by mouth daily.  *If you need a refill on your cardiac medications before your next appointment, please call your pharmacy*   Lab Work: Your physician recommends that you return for lab work in: TODAY CMP, Lipids, Aldolase If you have labs (blood work) drawn today and your tests are completely normal, you will receive your results only by: Marland Kitchen MyChart Message (if you have MyChart) OR . A paper copy in the mail If you have any lab test that is abnormal or we need to change your treatment, we will call you to review the results.   Testing/Procedures: None   Follow-Up: At [facility], you and your health needs are our priority.  As part of our continuing mission to provide you with exceptional heart care, we have created designated Provider Care Teams.  These Care Teams include your primary Cardiologist (physician) and Advanced Practice Providers (APPs -  Physician Assistants and Nurse Practitioners) who all work together to provide you with the care you need, when you need it.  We recommend signing up for the patient portal called "MyChart".  Sign up information is provided on this After Visit Summary.  MyChart is used to connect with patients for Virtual Visits (Telemedicine).  Patients are able to view lab/test results, encounter notes, upcoming appointments, etc.  Non-urgent messages can be sent to your provider as well.   To learn more about what you can do with MyChart, go to [email or web address].    Your next appointment:   6 month(s)  The format for your next appointment:   In Person  Provider:   Shirlee More, MD   Other Instructions

## 2020-04-12 NOTE — Addendum Note (Signed)
Addended by: Resa Miner I on: 04/12/2020 10:24 AM   Modules accepted: Orders

## 2020-04-12 NOTE — Addendum Note (Signed)
Addended by: Resa Miner I on: 04/12/2020 10:22 AM   Modules accepted: Orders

## 2020-04-13 ENCOUNTER — Telehealth: Payer: Self-pay

## 2020-04-13 ENCOUNTER — Other Ambulatory Visit: Payer: Self-pay

## 2020-04-13 LAB — ALDOLASE
Aldolase: 5.1 U/L (ref 3.3–10.3)
Aldolase: 5.3 U/L (ref 3.3–10.3)

## 2020-04-13 LAB — COMPREHENSIVE METABOLIC PANEL
ALT: 22 IU/L (ref 0–44)
ALT: 23 IU/L (ref 0–44)
AST: 23 IU/L (ref 0–40)
AST: 24 IU/L (ref 0–40)
Albumin/Globulin Ratio: 1.8 (ref 1.2–2.2)
Albumin/Globulin Ratio: 1.8 (ref 1.2–2.2)
Albumin: 4.6 g/dL (ref 3.7–4.7)
Albumin: 4.7 g/dL (ref 3.7–4.7)
Alkaline Phosphatase: 71 IU/L (ref 44–121)
Alkaline Phosphatase: 72 IU/L (ref 44–121)
BUN/Creatinine Ratio: 11 (ref 10–24)
BUN/Creatinine Ratio: 12 (ref 10–24)
BUN: 18 mg/dL (ref 8–27)
BUN: 18 mg/dL (ref 8–27)
Bilirubin Total: 0.6 mg/dL (ref 0.0–1.2)
Bilirubin Total: 0.6 mg/dL (ref 0.0–1.2)
CO2: 28 mmol/L (ref 20–29)
CO2: 29 mmol/L (ref 20–29)
Calcium: 10.1 mg/dL (ref 8.6–10.2)
Calcium: 10.1 mg/dL (ref 8.6–10.2)
Chloride: 98 mmol/L (ref 96–106)
Chloride: 98 mmol/L (ref 96–106)
Creatinine, Ser: 1.54 mg/dL — ABNORMAL HIGH (ref 0.76–1.27)
Creatinine, Ser: 1.57 mg/dL — ABNORMAL HIGH (ref 0.76–1.27)
GFR calc Af Amer: 49 mL/min/{1.73_m2} — ABNORMAL LOW (ref >59)
GFR calc Af Amer: 51 mL/min/{1.73_m2} — ABNORMAL LOW (ref >59)
GFR calc non Af Amer: 43 mL/min/{1.73_m2} — ABNORMAL LOW (ref >59)
GFR calc non Af Amer: 44 mL/min/{1.73_m2} — ABNORMAL LOW (ref >59)
Globulin, Total: 2.6 g/dL (ref 1.5–4.5)
Globulin, Total: 2.6 g/dL (ref 1.5–4.5)
Glucose: 123 mg/dL — ABNORMAL HIGH (ref 65–99)
Glucose: 126 mg/dL — ABNORMAL HIGH (ref 65–99)
Potassium: 5.1 mmol/L (ref 3.5–5.2)
Potassium: 5.3 mmol/L — ABNORMAL HIGH (ref 3.5–5.2)
Sodium: 137 mmol/L (ref 134–144)
Sodium: 138 mmol/L (ref 134–144)
Total Protein: 7.2 g/dL (ref 6.0–8.5)
Total Protein: 7.3 g/dL (ref 6.0–8.5)

## 2020-04-13 LAB — LIPID PANEL
Chol/HDL Ratio: 2.6 ratio (ref 0.0–5.0)
Chol/HDL Ratio: 2.6 ratio (ref 0.0–5.0)
Cholesterol, Total: 170 mg/dL (ref 100–199)
Cholesterol, Total: 173 mg/dL (ref 100–199)
HDL: 65 mg/dL (ref >39)
HDL: 66 mg/dL (ref >39)
LDL Chol Calc (NIH): 85 mg/dL (ref 0–99)
LDL Chol Calc (NIH): 87 mg/dL (ref 0–99)
Triglycerides: 110 mg/dL (ref 0–149)
Triglycerides: 113 mg/dL (ref 0–149)
VLDL Cholesterol Cal: 20 mg/dL (ref 5–40)
VLDL Cholesterol Cal: 20 mg/dL (ref 5–40)

## 2020-04-13 NOTE — Telephone Encounter (Signed)
Patient returning call.

## 2020-04-13 NOTE — Telephone Encounter (Signed)
Left message on patients voicemail to please return our call.   

## 2020-04-13 NOTE — Telephone Encounter (Signed)
Spoke with patient regarding results and recommendation.  Patient verbalizes understanding and is agreeable to plan of care. Advised patient to call back with any issues or concerns.  

## 2020-04-13 NOTE — Telephone Encounter (Signed)
-----   Message from Richardo Priest, MD sent at 04/13/2020  1:12 PM EST ----- Good results kidney function is stable liver function normal and his lipids are right on target good work no change in treatment

## 2020-04-13 NOTE — Telephone Encounter (Signed)
Patient advised of lab results

## 2020-07-04 DIAGNOSIS — C642 Malignant neoplasm of left kidney, except renal pelvis: Secondary | ICD-10-CM | POA: Diagnosis not present

## 2020-07-06 DIAGNOSIS — K8689 Other specified diseases of pancreas: Secondary | ICD-10-CM | POA: Diagnosis not present

## 2020-07-06 DIAGNOSIS — C642 Malignant neoplasm of left kidney, except renal pelvis: Secondary | ICD-10-CM | POA: Diagnosis not present

## 2020-07-06 DIAGNOSIS — I2510 Atherosclerotic heart disease of native coronary artery without angina pectoris: Secondary | ICD-10-CM | POA: Diagnosis not present

## 2020-07-06 DIAGNOSIS — N281 Cyst of kidney, acquired: Secondary | ICD-10-CM | POA: Diagnosis not present

## 2020-07-06 DIAGNOSIS — I700 Atherosclerosis of aorta: Secondary | ICD-10-CM | POA: Diagnosis not present

## 2020-07-06 DIAGNOSIS — I358 Other nonrheumatic aortic valve disorders: Secondary | ICD-10-CM | POA: Diagnosis not present

## 2020-07-11 DIAGNOSIS — C642 Malignant neoplasm of left kidney, except renal pelvis: Secondary | ICD-10-CM | POA: Diagnosis not present

## 2020-07-23 DIAGNOSIS — M7671 Peroneal tendinitis, right leg: Secondary | ICD-10-CM | POA: Diagnosis not present

## 2020-09-03 DIAGNOSIS — M79605 Pain in left leg: Secondary | ICD-10-CM | POA: Diagnosis not present

## 2020-09-03 DIAGNOSIS — M7671 Peroneal tendinitis, right leg: Secondary | ICD-10-CM | POA: Diagnosis not present

## 2020-09-11 DIAGNOSIS — M6281 Muscle weakness (generalized): Secondary | ICD-10-CM | POA: Diagnosis not present

## 2020-09-11 DIAGNOSIS — M79605 Pain in left leg: Secondary | ICD-10-CM | POA: Diagnosis not present

## 2020-09-11 DIAGNOSIS — M545 Low back pain, unspecified: Secondary | ICD-10-CM | POA: Diagnosis not present

## 2020-09-11 DIAGNOSIS — M25571 Pain in right ankle and joints of right foot: Secondary | ICD-10-CM | POA: Diagnosis not present

## 2020-09-11 DIAGNOSIS — M2560 Stiffness of unspecified joint, not elsewhere classified: Secondary | ICD-10-CM | POA: Diagnosis not present

## 2020-10-30 NOTE — Progress Notes (Signed)
Cardiology Office Note:    Date:  10/31/2020   ID:  Jason Bauer, DOB Oct 09, 1945, MRN [id number]  PCP:  Street, Jason [place], Jason Bauer  Cardiologist:  Jason More, Jason Bauer    Referring Jason Bauer: [address], Jason Bauer, *    ASSESSMENT:    1. Coronary artery disease of native artery of native heart with stable angina pectoris ([place])   2. Essential hypertension   3. Mixed hyperlipidemia    PLAN:    In order of problems listed above:  He continues to do well after PCI and current medical therapy New York Heart Association class I continue aspirin clopidogrel carvedilol and lipid-lowering with rosuvastatin. Decrease his ACE inhibitor 50% Continue statin check lipid profile CMP   Next appointment: 6 months   Medication Adjustments/Labs and Tests Ordered: Current medicines are reviewed at length with the patient today.  Concerns regarding medicines are outlined above.  Orders Placed This Encounter  Procedures   Comprehensive metabolic panel   Lipid panel   EKG 12-Lead   Meds ordered this encounter  Medications   rosuvastatin (CRESTOR) 5 MG tablet    Sig: Take 1 tablet (5 mg total) by mouth daily.    Dispense:  90 tablet    Refill:  3   lisinopril (ZESTRIL) 20 MG tablet    Sig: Take 1 tablet (20 mg total) by mouth daily.    Dispense:  90 tablet    Refill:  3    This prescription was filled on 11/25/2019. Any refills authorized will be placed on file.    Chief Complaint  Patient presents with   Follow-up    History of Present Illness:    Jason Bauer is a 75 y.o. male with a hx of CAD with PCI and stent to the left anterior sending coronary artery in 2003 hypertension hyperlipidemia right bundle branch block and laparoscopic radical nephrectomy August 2021 last seen 01/20/2020.  Compliance with diet, lifestyle and medications: Yes  When last seen by me we transition to rosuvastatin which is well-tolerated no muscle pain or weakness.  He has had low back radicular pain  and joint inflammation is concerned is on the basis of COVID vaccine.  He is exercising he is lost weight no chest pain shortness of breath palpitation or syncope but has had some lightheadedness at times blood pressure is in the range of 701 systolic or less. Past Medical History:  Diagnosis Date   Anginal pain ([place])    Anxiety    Arthritis    Carcinoma of oropharynx ([place]) 07/30/2011   Chronic kidney disease    Coronary artery disease involving native coronary artery of native heart 08/01/2015   PCI and stent LAD 2003   Depression    Diverticulosis    Essential hypertension 08/01/2015   GERD (gastroesophageal reflux disease)    Hiatal hernia    Hyperlipidemia 08/01/2015   Hypothyroidism    Preoperative cardiovascular examination 08/01/2015   PTSD (post-traumatic stress disorder)    Sleep apnea    CPAP    Past Surgical History:  Procedure Laterality Date   ANAL FISTULOTOMY     ARTHROSCOPY KNEE W/ DRILLING Left    CARDIAC CATHETERIZATION  2003   CORONARY ANGIOPLASTY     CYSTOSCOPY/URETEROSCOPY/HOLMIUM LASER/STENT PLACEMENT Left 11/24/2019   Procedure: CYSTOSCOPY/RETROGRADE/URETEROSCOPY/STENT PLACEMENT;  Surgeon: Raynelle Bring, Jason Bauer;  Location: [facility];  Service: Urology;  Laterality: Left;   HEMORRHOID SURGERY     LAPAROSCOPIC NEPHRECTOMY Left 12/01/2019   Procedure: LAPAROSCOPIC NEPHRECTOMY;  Surgeon: Alinda Money,  Wynetta Emery, Jason Bauer;  Location: [facility];  Service: Urology;  Laterality: Left;  ONLY NEEDS 160 MIN   TONSILLECTOMY Left 2006    Current Medications: Current Meds  Medication Sig   aspirin EC 81 MG tablet Take 81 mg by mouth at bedtime.    carvedilol (COREG) 12.5 MG tablet Take 6.25 mg by mouth 2 (two) times daily with a meal.   cholecalciferol (VITAMIN D3) 10 MCG (400 UNIT) TABS tablet Take 400 Units by mouth daily.   clopidogrel (PLAVIX) 75 MG tablet Take 1 tablet (75 mg total) by mouth daily.   Coenzyme Q10 (CO Q 10) 100 MG CAPS Take 100 mg by mouth daily.   levothyroxine  (SYNTHROID) 125 MCG tablet Take 125 mcg by mouth daily before breakfast.   Magnesium Oxide 420 MG TABS Take 840 mg by mouth at bedtime.    Nutritional Supplements (JUICE PLUS FIBRE PO) Take 4 tablets by mouth at bedtime. Fruit (2) + Vegetable (2)   Omega-3 Fatty Acids (FISH OIL) 1000 MG CAPS Take 1,000 mg by mouth daily.   pantoprazole (PROTONIX) 40 MG tablet Take 40 mg by mouth daily before breakfast.    sertraline (ZOLOFT) 50 MG tablet Take 50 mg by mouth 2 (two) times daily.   vitamin B-12 (CYANOCOBALAMIN) 500 MCG tablet Take 500 mcg by mouth daily.   [DISCONTINUED] lisinopril (ZESTRIL) 20 MG tablet Take 1 tablet (20 mg total) by mouth 2 (two) times daily.     Allergies:   Patient has no known allergies.   Social History   Socioeconomic History   Marital status: Married    Spouse name: Not on file   Number of children: Not on file   Years of education: Not on file   Highest education level: Not on file  Occupational History   Not on file  Tobacco Use   Smoking status: Never   Smokeless tobacco: Never  Vaping Use   Vaping Use: Never used  Substance and Sexual Activity   Alcohol use: Not Currently   Drug use: Never   Sexual activity: Yes  Other Topics Concern   Not on file  Social History Narrative   Not on file   Social Determinants of Health   Financial Resource Strain: Not on file  Food Insecurity: Not on file  Transportation Needs: Not on file  Physical Activity: Not on file  Stress: Not on file  Social Connections: Not on file     Family History: The patient's family history includes Bladder Cancer in his father; CAD in his father; Cancer in his father; Diabetes in his paternal grandfather; Hypertension in his father; Scoliosis in his mother. ROS:   Please see the history of present illness.    All other systems reviewed and are negative.  EKGs/Labs/Other Studies Reviewed:    The following studies were reviewed today:    Recent Labs: 11/28/2019:  Platelets 283 12/02/2019: Hemoglobin 10.2 04/12/2020: ALT 23; BUN 18; Creatinine, Ser 1.57; Potassium 5.1; Sodium 137  Recent Lipid Panel    Component Value Date/Time   CHOL 173 04/12/2020 1025   TRIG 113 04/12/2020 1025   HDL 66 04/12/2020 1025   CHOLHDL 2.6 04/12/2020 1025   LDLCALC 87 04/12/2020 1025    Physical Exam:    VS:  BP (!) 150/80 (BP Location: Right Arm, Patient Position: Sitting, Cuff Size: Normal)   Pulse (!) 53   Ht 5\' 10"  (1.778 m)   Wt 181 lb (82.1 kg)   SpO2 99%   BMI  25.97 kg/m     Wt Readings from Last 3 Encounters:  10/31/20 181 lb (82.1 kg)  04/12/20 182 lb 12.8 oz (82.9 kg)  01/20/20 180 lb (81.6 kg)     GEN:  Well nourished, well developed in no acute distress HEENT: Normal NECK: No JVD; No carotid bruits LYMPHATICS: No lymphadenopathy CARDIAC: RRR, no murmurs, rubs, gallops RESPIRATORY:  Clear to auscultation without rales, wheezing or rhonchi  ABDOMEN: Soft, non-tender, non-distended MUSCULOSKELETAL:  No edema; No deformity  SKIN: Warm and dry NEUROLOGIC:  Alert and oriented x 3 PSYCHIATRIC:  Normal affect    Signed, Jason More, Jason Bauer  10/31/2020 2:53 PM    [facility]

## 2020-10-31 ENCOUNTER — Ambulatory Visit (INDEPENDENT_AMBULATORY_CARE_PROVIDER_SITE_OTHER): Payer: Medicare Other | Admitting: Cardiology

## 2020-10-31 ENCOUNTER — Encounter: Payer: Self-pay | Admitting: Cardiology

## 2020-10-31 ENCOUNTER — Other Ambulatory Visit: Payer: Self-pay

## 2020-10-31 VITALS — BP 150/80 | HR 53 | Ht 70.0 in | Wt 181.0 lb

## 2020-10-31 DIAGNOSIS — I10 Essential (primary) hypertension: Secondary | ICD-10-CM | POA: Diagnosis not present

## 2020-10-31 DIAGNOSIS — E782 Mixed hyperlipidemia: Secondary | ICD-10-CM

## 2020-10-31 DIAGNOSIS — I25118 Atherosclerotic heart disease of native coronary artery with other forms of angina pectoris: Secondary | ICD-10-CM

## 2020-10-31 MED ORDER — LISINOPRIL 20 MG PO TABS: 20.0000 mg | ORAL_TABLET | Freq: Every day | ORAL | 3 refills | Status: DC

## 2020-10-31 MED ORDER — ROSUVASTATIN CALCIUM 5 MG PO TABS: 5.0000 mg | ORAL_TABLET | Freq: Every day | ORAL | 3 refills | Status: DC

## 2020-10-31 NOTE — Patient Instructions (Signed)
Medication Instructions:  Your physician has recommended you make the following change in your medication:  DECREASE: Lisinopril 20 mg take one tablet by mouth daily.  *If you need a refill on your cardiac medications before your next appointment, please call your pharmacy*   Lab Work: Your physician recommends that you return for lab work in: [place], Lipids If you have labs (blood work) drawn today and your tests are completely normal, you will receive your results only by: [place] (if you have MyChart) OR A paper copy in the mail If you have any lab test that is abnormal or we need to change your treatment, we will call you to review the results.   Testing/Procedures: None   Follow-Up: At [facility], you and your health needs are our priority.  As part of our continuing mission to provide you with exceptional heart care, we have created designated Provider Care Teams.  These Care Teams include your primary Cardiologist (physician) and Advanced Practice Providers (APPs -  Physician Assistants and Nurse Practitioners) who all work together to provide you with the care you need, when you need it.  We recommend signing up for the patient portal called "MyChart".  Sign up information is provided on this After Visit Summary.  MyChart is used to connect with patients for Virtual Visits (Telemedicine).  Patients are able to view lab/test results, encounter notes, upcoming appointments, etc.  Non-urgent messages can be sent to your provider as well.   To learn more about what you can do with MyChart, go to [email or web address].    Your next appointment:   6 month(s)  The format for your next appointment:   In Person  Provider:   Shirlee More, MD   Other Instructions

## 2020-11-01 ENCOUNTER — Telehealth: Payer: Self-pay

## 2020-11-01 LAB — COMPREHENSIVE METABOLIC PANEL
ALT: 25 IU/L (ref 0–44)
AST: 24 IU/L (ref 0–40)
Albumin/Globulin Ratio: 2 (ref 1.2–2.2)
Albumin: 4.9 g/dL — ABNORMAL HIGH (ref 3.7–4.7)
Alkaline Phosphatase: 59 IU/L (ref 44–121)
BUN/Creatinine Ratio: 19 (ref 10–24)
BUN: 31 mg/dL — ABNORMAL HIGH (ref 8–27)
Bilirubin Total: 0.7 mg/dL (ref 0.0–1.2)
CO2: 23 mmol/L (ref 20–29)
Calcium: 9.8 mg/dL (ref 8.6–10.2)
Chloride: 101 mmol/L (ref 96–106)
Creatinine, Ser: 1.64 mg/dL — ABNORMAL HIGH (ref 0.76–1.27)
Globulin, Total: 2.5 g/dL (ref 1.5–4.5)
Glucose: 80 mg/dL (ref 65–99)
Potassium: 5.2 mmol/L (ref 3.5–5.2)
Sodium: 137 mmol/L (ref 134–144)
Total Protein: 7.4 g/dL (ref 6.0–8.5)
eGFR: 43 mL/min/{1.73_m2} — ABNORMAL LOW (ref >59)

## 2020-11-01 LAB — LIPID PANEL
Chol/HDL Ratio: 2.8 ratio (ref 0.0–5.0)
Cholesterol, Total: 153 mg/dL (ref 100–199)
HDL: 55 mg/dL (ref >39)
LDL Chol Calc (NIH): 73 mg/dL (ref 0–99)
Triglycerides: 147 mg/dL (ref 0–149)
VLDL Cholesterol Cal: 25 mg/dL (ref 5–40)

## 2020-11-01 NOTE — Telephone Encounter (Signed)
Spoke with patient regarding results and recommendation.  Patient verbalizes understanding and is agreeable to plan of care. Advised patient to call back with any issues or concerns.  

## 2020-11-01 NOTE — Telephone Encounter (Signed)
-----   Message from Richardo Priest, MD sent at 11/01/2020  7:29 AM EDT ----- Normal or stable result  Stable CKD  No changes

## 2020-11-01 NOTE — Telephone Encounter (Signed)
Left message on patients voicemail to please return our call.   

## 2020-12-26 ENCOUNTER — Encounter: Payer: Self-pay | Admitting: Podiatry

## 2020-12-26 ENCOUNTER — Ambulatory Visit (INDEPENDENT_AMBULATORY_CARE_PROVIDER_SITE_OTHER): Payer: Medicare Other

## 2020-12-26 ENCOUNTER — Other Ambulatory Visit: Payer: Self-pay

## 2020-12-26 ENCOUNTER — Ambulatory Visit (INDEPENDENT_AMBULATORY_CARE_PROVIDER_SITE_OTHER): Payer: Medicare Other | Admitting: Podiatry

## 2020-12-26 DIAGNOSIS — S99911A Unspecified injury of right ankle, initial encounter: Secondary | ICD-10-CM | POA: Diagnosis not present

## 2020-12-26 DIAGNOSIS — M7671 Peroneal tendinitis, right leg: Secondary | ICD-10-CM | POA: Diagnosis not present

## 2020-12-26 DIAGNOSIS — I25118 Atherosclerotic heart disease of native coronary artery with other forms of angina pectoris: Secondary | ICD-10-CM | POA: Diagnosis not present

## 2020-12-26 DIAGNOSIS — T148XXA Other injury of unspecified body region, initial encounter: Secondary | ICD-10-CM

## 2020-12-26 MED ORDER — TRIAMCINOLONE ACETONIDE 10 MG/ML IJ SUSP
10.0000 mg | Freq: Once | INTRAMUSCULAR | Status: AC
  Administered 2020-12-26: 10 mg

## 2020-12-26 NOTE — Progress Notes (Signed)
Subjective:   Patient ID: Jason Bauer, male   DOB: 75 y.o.   MRN: [id number]   HPI Patient stated he had an injury 16 months ago currently his right ankle has been swollen and sore since then.  Stated he wore a boot for a period of time which gave him temporary relief but has a lot of pain with that and is getting ready to go on a cruise in approximate 4 weeks   Review of Systems  All other systems reviewed and are negative.      Objective:  Physical Exam Vitals and nursing note reviewed.  Constitutional:      Appearance: He is well-developed.  Pulmonary:     Effort: Pulmonary effort is normal.  Musculoskeletal:        General: Normal range of motion.  Skin:    General: Skin is warm.  Neurological:     Mental Status: He is alert.    Neurovascular status was found to be intact muscle strength was found to be adequate range of motion with moderate subtalar joint restriction right which appears to be more due to pain.  There is quite a bit of discomfort in the peroneal complex as it comes underneath the lateral malleolus with some prominence of the area after having had an inversion injury 16 months ago and previous boot therapy.  He is going out of town is looking for short-term treatment with possibility for long-term relief of symptoms     Assessment:  Peroneal tendinitis right possibility for torn tendon or bony encroachment around this area secondary to injury     Plan:  H&P x-ray reviewed condition discussed and at this point I did do a careful injection of the peroneal tendon as it comes under the lateral malleolus 3 mg Dexasone Kenalog 5 mg Xylocaine and applied a fascial brace to lift up the lateral side of the foot along with aggressive ice and discussed future treatments when he gets back from his trip and will be seen back to reevaluate  X-rays were negative for signs of there is a acute fracture or diastases injury of the ankle

## 2021-01-07 ENCOUNTER — Telehealth: Payer: Self-pay | Admitting: Cardiology

## 2021-01-07 NOTE — Telephone Encounter (Signed)
pts wife is wanting to let Dr.Munley know that pt is going out of the country and would like to know Drs opinion of them getting the new omnicron vaccine... please advise.

## 2021-01-07 NOTE — Telephone Encounter (Signed)
Left message on patients voicemail to please return our call.   

## 2021-01-07 NOTE — Telephone Encounter (Signed)
Pt is returning call to [facility] from earlier today. Please advise pt further

## 2021-01-07 NOTE — Telephone Encounter (Signed)
Spoke to the patient just now and let him know Dr. Munley's recommendations. He verbalizes understanding and thanks me for the call back.  

## 2021-02-01 DIAGNOSIS — Z23 Encounter for immunization: Secondary | ICD-10-CM | POA: Diagnosis not present

## 2021-02-13 DIAGNOSIS — C642 Malignant neoplasm of left kidney, except renal pelvis: Secondary | ICD-10-CM | POA: Diagnosis not present

## 2021-02-18 DIAGNOSIS — Z85528 Personal history of other malignant neoplasm of kidney: Secondary | ICD-10-CM | POA: Diagnosis not present

## 2021-02-18 DIAGNOSIS — K589 Irritable bowel syndrome without diarrhea: Secondary | ICD-10-CM | POA: Diagnosis not present

## 2021-02-18 DIAGNOSIS — J8410 Pulmonary fibrosis, unspecified: Secondary | ICD-10-CM | POA: Diagnosis not present

## 2021-02-18 DIAGNOSIS — C642 Malignant neoplasm of left kidney, except renal pelvis: Secondary | ICD-10-CM | POA: Diagnosis not present

## 2021-02-18 DIAGNOSIS — K863 Pseudocyst of pancreas: Secondary | ICD-10-CM | POA: Diagnosis not present

## 2021-02-18 DIAGNOSIS — Z85819 Personal history of malignant neoplasm of unspecified site of lip, oral cavity, and pharynx: Secondary | ICD-10-CM | POA: Diagnosis not present

## 2021-02-18 DIAGNOSIS — I2510 Atherosclerotic heart disease of native coronary artery without angina pectoris: Secondary | ICD-10-CM | POA: Diagnosis not present

## 2021-02-20 DIAGNOSIS — C642 Malignant neoplasm of left kidney, except renal pelvis: Secondary | ICD-10-CM | POA: Diagnosis not present

## 2021-03-13 ENCOUNTER — Ambulatory Visit (INDEPENDENT_AMBULATORY_CARE_PROVIDER_SITE_OTHER): Payer: Medicare Other | Admitting: Podiatry

## 2021-03-13 ENCOUNTER — Other Ambulatory Visit: Payer: Self-pay

## 2021-03-13 ENCOUNTER — Ambulatory Visit (INDEPENDENT_AMBULATORY_CARE_PROVIDER_SITE_OTHER): Payer: Medicare Other

## 2021-03-13 DIAGNOSIS — I25118 Atherosclerotic heart disease of native coronary artery with other forms of angina pectoris: Secondary | ICD-10-CM

## 2021-03-13 DIAGNOSIS — S99911A Unspecified injury of right ankle, initial encounter: Secondary | ICD-10-CM | POA: Diagnosis not present

## 2021-03-13 DIAGNOSIS — T148XXA Other injury of unspecified body region, initial encounter: Secondary | ICD-10-CM | POA: Diagnosis not present

## 2021-03-14 NOTE — Progress Notes (Signed)
Subjective:   Patient ID: Jason Bauer, male   DOB: 75 y.o.   MRN: [id number]   HPI Patient states he had temporary relief from his ankle pain with the injection we did but its come back full throttle and it really bothers him on the outside of his ankle and limits any form of activities   ROS      Objective:  Physical Exam  Neuro vascular status intact muscle strength appears adequate but there is a lot of pain underneath the lateral malleolus and extending over the calcaneus with a appears to be possible bone spur of the calcaneus which is creating irritation of the tendon with strong possibility of interstitial tear of the tendon     Assessment:  Possibility for tear of the right peroneal tendon versus poor including bone spur formation     Plan:  H&P reviewed condition and I am sending for MRI of the right ankle to try to ascertain as to whether or not there is a tear of the peroneal tendon and whether or not there may be a bone spur or other pathology in the area.  When we get the results we will see him back and decide on a long-term course

## 2021-04-06 ENCOUNTER — Ambulatory Visit
Admission: RE | Admit: 2021-04-06 | Discharge: 2021-04-06 | Disposition: A | Discharge status: Home / Self Care | Payer: Medicare Other | Source: Ambulatory Visit | Attending: Podiatry | Admitting: Podiatry

## 2021-04-06 ENCOUNTER — Other Ambulatory Visit: Payer: Self-pay

## 2021-04-06 DIAGNOSIS — S99911A Unspecified injury of right ankle, initial encounter: Secondary | ICD-10-CM | POA: Diagnosis not present

## 2021-04-06 DIAGNOSIS — S86311A Strain of muscle(s) and tendon(s) of peroneal muscle group at lower leg level, right leg, initial encounter: Secondary | ICD-10-CM | POA: Diagnosis not present

## 2021-04-06 DIAGNOSIS — T148XXA Other injury of unspecified body region, initial encounter: Secondary | ICD-10-CM

## 2021-04-06 DIAGNOSIS — M65871 Other synovitis and tenosynovitis, right ankle and foot: Secondary | ICD-10-CM | POA: Diagnosis not present

## 2021-04-08 NOTE — Progress Notes (Signed)
Probably going to need surgery. Kiasha please call and schedule him with Dr. Jacqualyn Posey after the 1st of the year

## 2021-04-11 DIAGNOSIS — L578 Other skin changes due to chronic exposure to nonionizing radiation: Secondary | ICD-10-CM | POA: Diagnosis not present

## 2021-04-11 DIAGNOSIS — L821 Other seborrheic keratosis: Secondary | ICD-10-CM | POA: Diagnosis not present

## 2021-04-11 DIAGNOSIS — L820 Inflamed seborrheic keratosis: Secondary | ICD-10-CM | POA: Diagnosis not present

## 2021-04-15 ENCOUNTER — Other Ambulatory Visit: Payer: Self-pay

## 2021-04-15 ENCOUNTER — Ambulatory Visit (INDEPENDENT_AMBULATORY_CARE_PROVIDER_SITE_OTHER): Payer: Medicare Other | Admitting: Podiatry

## 2021-04-15 DIAGNOSIS — T148XXA Other injury of unspecified body region, initial encounter: Secondary | ICD-10-CM

## 2021-04-15 DIAGNOSIS — M7671 Peroneal tendinitis, right leg: Secondary | ICD-10-CM

## 2021-04-15 DIAGNOSIS — I25118 Atherosclerotic heart disease of native coronary artery with other forms of angina pectoris: Secondary | ICD-10-CM

## 2021-04-17 NOTE — Progress Notes (Signed)
Subjective:   Patient ID: [name], male   DOB: 75 y.o.   MRN: [id number]   HPI Patient states he continues to have pain in the outside of his right foot and states this has been a several year ordeal with a gradual worsening over that time.  Patient is not as active as he used to be and states that he would like to be able to resume activity at 1 point in future   ROS      Objective:  Physical Exam  Neurovascular status intact with swelling of the fusiform nature of the peroneal tendon proximal to its insertion into the navicular right with a apparent prominent peroneal tubercle with quite a bit of irritation in this area.  It appears to be an approximate 3 cm area of fusiform swelling and extreme discomfort with mild discomfort surrounding that area     Assessment:  Strong probability for interstitial tear of the peroneal longus tendon right with apparent medial tubercle involvement     Plan:  H&P educated him and wife on condition and reviewed MRI.  I do think that this will require repair and also probable smoothing of the peroneal tubercle.  I educated him on the deformity of the MRI and recovery of procedure and he wants to have this done in 2023.  I am referring him to Dr. Jacqualyn Posey and discussed this case with him and he will see him first week in January with surgery tentatively scheduled for mid January pending his appointment with Dr. Jacqualyn Posey

## 2021-05-03 ENCOUNTER — Ambulatory Visit (INDEPENDENT_AMBULATORY_CARE_PROVIDER_SITE_OTHER): Payer: Medicare Other | Admitting: Podiatry

## 2021-05-03 ENCOUNTER — Encounter: Payer: Self-pay | Admitting: Podiatry

## 2021-05-03 ENCOUNTER — Other Ambulatory Visit: Payer: Self-pay

## 2021-05-03 DIAGNOSIS — S99911S Unspecified injury of right ankle, sequela: Secondary | ICD-10-CM | POA: Diagnosis not present

## 2021-05-03 DIAGNOSIS — M7671 Peroneal tendinitis, right leg: Secondary | ICD-10-CM | POA: Diagnosis not present

## 2021-05-03 DIAGNOSIS — T148XXA Other injury of unspecified body region, initial encounter: Secondary | ICD-10-CM | POA: Diagnosis not present

## 2021-05-03 NOTE — Patient Instructions (Signed)

## 2021-05-07 NOTE — Progress Notes (Signed)
Subjective: 76 year old male presents the office today for surgical consultation given chronic pain to his right ankle.  He has been under the care of Dr. Paulla Dolly and an MRI was performed which did reveal moderate tendinosis of the peroneus longus with longitudinal split tear of the peroneal tendon and moderate tendinosis of the peroneus brevis tendon.  Given continued discomfort despite conservative care he was proceed with surgical intervention.  Objective: AAO x3, NAD DP/PT pulses palpable bilaterally, CRT less than 3 seconds There is tenderness palpation on the course of the peroneal tendon posterior and inferior to the lateral malleolus towards the fifth metatarsal base.  Bony prominence is also noted.  Clinically the tendon appears to be intact.  No area of pinpoint tenderness.  There is mild edema present to the area but there is no associated erythema or warmth.  No open lesions. No edema, erythema, increase in warmth to bilateral lower extremities.  No open lesions or pre-ulcerative lesions.  No pain with calf compression, swelling, warmth, erythema  Assessment: Peroneal tendon split tear  Plan: -All treatment options discussed with the patient including all alternatives, risks, complications.  -I reviewed the MRI with the patient.  Discussed with conservative as well as surgical treatment options.  He has tried numerous conservative treatments over the last year without any significant improvement the patient wants to proceed with surgical intervention given ongoing pain.  Discussed plan peroneal tendon repair.  He has a prominent bone which could be causing irritation of the tendon as well which we may shave down during surgery if able. -The incision placement as well as the postoperative course was discussed with the patient. I discussed risks of the surgery which include, but not limited to, infection, bleeding, pain, swelling, need for further surgery, delayed or nonhealing, painful or  ugly scar, numbness or sensation changes, over/under correction, recurrence, transfer lesions, further deformity, hardware failure, DVT/PE, loss of toe/foot. Patient understands these risks and wishes to proceed with surgery. The surgical consent was reviewed with the patient all 3 pages were signed. No promises or guarantees were given to the outcome of the procedure. All questions were answered to the best of my ability. Before the surgery the patient was encouraged to call the office if there is any further questions. The surgery will be performed at the [facility] on an outpatient basis. -We will need medical clearance. -Patient encouraged to call the office with any questions, concerns, change in symptoms.   35 minutes were spent with patient greater than 50% of time was in face-to-face contact.  Trula Slade DPM

## 2021-05-10 ENCOUNTER — Telehealth: Payer: Self-pay

## 2021-05-10 ENCOUNTER — Encounter: Payer: Self-pay | Admitting: Podiatry

## 2021-05-10 NOTE — Telephone Encounter (Signed)
° °  Pre-operative Risk Assessment    Patient Name: Jason Bauer  DOB: 1945-11-16 MRN: [id number]      Request for Surgical Clearance    Procedure:   Peroneal tendon split tear  Date of Surgery:  Clearance TBD                                 Surgeon:  Dr. Celesta Gentile Surgeon's Group or Practice Name:  [facility] Phone number:  [phone] Fax number:  [phone]   Type of Clearance Requested:   - Medical    Type of Anesthesia:  General    Additional requests/questions:  Are there any contra-indications? Does the patient need to stop taking any medications prior to his surgery?    Signed, Lowella Grip   05/10/2021, 2:14 PM

## 2021-05-10 NOTE — Telephone Encounter (Signed)
Unable to reach the patient, unable to leave a message either

## 2021-05-10 NOTE — Telephone Encounter (Signed)
° °  Name: Jason Bauer  DOB: 29-Dec-1945  MRN: [id number]  Primary Cardiologist: Shirlee More, MD  Chart reviewed as part of pre-operative protocol coverage. Because of Jason Bauer's past medical history and time since last visit, he will require a follow-up visit in order to better assess preoperative cardiovascular risk.  Pre-op covering staff: - Please schedule appointment and call patient to inform them. If patient already had an upcoming appointment within acceptable timeframe, please add "pre-op clearance" to the appointment notes so provider is aware. - Please contact requesting surgeon's office via preferred method (i.e, phone, fax) to inform them of need for appointment prior to surgery.  If applicable, this message will also be routed to pharmacy pool and/or primary cardiologist for input on holding anticoagulant/antiplatelet agent as requested below so that this information is available to the clearing provider at time of patient's appointment.   [place], [place]  05/10/2021, 4:01 PM

## 2021-05-10 NOTE — Telephone Encounter (Signed)
Patient scheduled on 2/3 with Dr. Bettina Gavia as he was diagnosed with COVID this morning and is going to quarantine for 14 days.    Encouraged patient to call back with any questions or concerns.

## 2021-05-10 NOTE — Telephone Encounter (Signed)
Pt has been scheduled to see Dr. Bettina Gavia, 05/31/21, and clearance will be addressed at that time.  Will route to the requesting surgeon's office to make them aware.

## 2021-05-13 ENCOUNTER — Telehealth: Payer: Self-pay | Admitting: *Deleted

## 2021-05-13 NOTE — Telephone Encounter (Signed)
Letter by Trula Slade, DPM on 05/10/2021 (Status: Sent)     [facility] [place], [place], [place]  [address] Phone:  [phone]   Fax:  [phone]   May 10, 2021     Re: Medical clearance for surgery on 05/29/2021     Dear Dr. Shirlee More,     Jason Bauer, DOB 27-Feb-1946, is a mutual patient.  I am treating him for Peroneal tendon split tear.  He needs surgery which would be performed at an outpatient ambulatory surgical center.  He would receive general anesthetic.  The procedures will be a peroneal tendon repair.  I would like written medical clearance.  Are there any contra-indications?  Does the patient need to stop taking any medications prior to his surgery?  If so, please give the instructions.  You can respond via Epic in-basket or fax to [phone].     Sincerely,      Celesta Gentile, DPM  [facility], [phone]               Pre-operative Risk Assessment    Patient Name: Jason Bauer  DOB: December 05, 1945 MRN: [id number]      Request for Surgical Clearance    Procedure:   PERONEAL TENDON REPAIR  Date of Surgery:  Clearance TBD                                 Surgeon:  Celesta Gentile, DPM Surgeon's Group or Practice Name:  [facility] Phone number:  [phone] Fax number:  [phone]   Type of Clearance Requested:   - Medical  - Pharmacy:  Hold Aspirin and Clopidogrel (Plavix)     Type of Anesthesia:  General    Additional requests/questions:    Jason Bauer   05/13/2021, 9:05 AM

## 2021-05-13 NOTE — Telephone Encounter (Signed)
Primary Cardiologist:Jason Bettina Gavia, MD  Chart reviewed as part of pre-operative protocol coverage. Because of Jason Bauer's past medical history and time since last visit, he/she will require a follow-up visit in order to better assess preoperative cardiovascular risk.  Pre-op covering staff: - Please schedule appointment and call patient to inform them. - Please contact requesting surgeon's office via preferred method (i.e, phone, fax) to inform them of need for appointment prior to surgery.  If applicable, this message will also be routed to pharmacy pool and/or primary cardiologist for input on holding anticoagulant/antiplatelet agent as requested below so that this information is available at time of patient's appointment.   Deberah Pelton, NP  05/13/2021, 12:27 PM

## 2021-05-13 NOTE — Telephone Encounter (Signed)
Pt has appt 05/31/21 with Dr. Bettina Gavia. Will forward notes to MD for upcoming appt. I will send FYI to requesting office pt has appt 05/31/21.

## 2021-05-30 ENCOUNTER — Other Ambulatory Visit: Payer: Self-pay

## 2021-05-30 DIAGNOSIS — C760 Malignant neoplasm of head, face and neck: Secondary | ICD-10-CM | POA: Insufficient documentation

## 2021-05-30 DIAGNOSIS — F32A Depression, unspecified: Secondary | ICD-10-CM | POA: Insufficient documentation

## 2021-05-30 DIAGNOSIS — K449 Diaphragmatic hernia without obstruction or gangrene: Secondary | ICD-10-CM | POA: Insufficient documentation

## 2021-05-30 DIAGNOSIS — H9190 Unspecified hearing loss, unspecified ear: Secondary | ICD-10-CM | POA: Insufficient documentation

## 2021-05-30 DIAGNOSIS — G4733 Obstructive sleep apnea (adult) (pediatric): Secondary | ICD-10-CM

## 2021-05-30 DIAGNOSIS — I2510 Atherosclerotic heart disease of native coronary artery without angina pectoris: Secondary | ICD-10-CM

## 2021-05-30 DIAGNOSIS — I209 Angina pectoris, unspecified: Secondary | ICD-10-CM | POA: Insufficient documentation

## 2021-05-30 DIAGNOSIS — E039 Hypothyroidism, unspecified: Secondary | ICD-10-CM | POA: Insufficient documentation

## 2021-05-30 DIAGNOSIS — F339 Major depressive disorder, recurrent, unspecified: Secondary | ICD-10-CM

## 2021-05-30 DIAGNOSIS — H524 Presbyopia: Secondary | ICD-10-CM | POA: Insufficient documentation

## 2021-05-30 DIAGNOSIS — M25562 Pain in left knee: Secondary | ICD-10-CM | POA: Insufficient documentation

## 2021-05-30 DIAGNOSIS — M1990 Unspecified osteoarthritis, unspecified site: Secondary | ICD-10-CM | POA: Insufficient documentation

## 2021-05-30 DIAGNOSIS — F4310 Post-traumatic stress disorder, unspecified: Secondary | ICD-10-CM | POA: Insufficient documentation

## 2021-05-30 DIAGNOSIS — K219 Gastro-esophageal reflux disease without esophagitis: Secondary | ICD-10-CM | POA: Insufficient documentation

## 2021-05-30 DIAGNOSIS — N189 Chronic kidney disease, unspecified: Secondary | ICD-10-CM | POA: Insufficient documentation

## 2021-05-30 DIAGNOSIS — H40053 Ocular hypertension, bilateral: Secondary | ICD-10-CM

## 2021-05-30 DIAGNOSIS — H353132 Nonexudative age-related macular degeneration, bilateral, intermediate dry stage: Secondary | ICD-10-CM

## 2021-05-30 DIAGNOSIS — K5790 Diverticulosis of intestine, part unspecified, without perforation or abscess without bleeding: Secondary | ICD-10-CM | POA: Insufficient documentation

## 2021-05-30 DIAGNOSIS — F419 Anxiety disorder, unspecified: Secondary | ICD-10-CM | POA: Insufficient documentation

## 2021-05-30 DIAGNOSIS — G4700 Insomnia, unspecified: Secondary | ICD-10-CM

## 2021-05-30 DIAGNOSIS — I259 Chronic ischemic heart disease, unspecified: Secondary | ICD-10-CM

## 2021-05-30 HISTORY — DX: Atherosclerotic heart disease of native coronary artery without angina pectoris: I25.10

## 2021-05-30 HISTORY — DX: Nonexudative age-related macular degeneration, bilateral, intermediate dry stage: H35.3132

## 2021-05-30 HISTORY — DX: Insomnia, unspecified: G47.00

## 2021-05-30 HISTORY — DX: Obstructive sleep apnea (adult) (pediatric): G47.33

## 2021-05-30 HISTORY — DX: Presbyopia: H52.4

## 2021-05-30 HISTORY — DX: Ocular hypertension, bilateral: H40.053

## 2021-05-30 HISTORY — DX: Pain in left knee: M25.562

## 2021-05-30 HISTORY — DX: Gastro-esophageal reflux disease without esophagitis: K21.9

## 2021-05-30 HISTORY — DX: Malignant neoplasm of head, face and neck: C76.0

## 2021-05-30 HISTORY — DX: Unspecified hearing loss, unspecified ear: H91.90

## 2021-05-30 HISTORY — DX: Major depressive disorder, recurrent, unspecified: F33.9

## 2021-05-30 HISTORY — DX: Chronic ischemic heart disease, unspecified: I25.9

## 2021-05-30 NOTE — Progress Notes (Signed)
Cardiology Office Note:    Date:  05/31/2021   ID:  Jason, Bauer Oct 20, 1945, MRN [id number]  PCP:  Street, Jason Mt, MD  Cardiologist:  Jason More, MD    Referring MD: [address], Jason Bauer, *    ASSESSMENT:    1. Preoperative cardiovascular examination   2. Coronary artery disease of native artery of native heart with stable angina pectoris ([place])   3. Essential hypertension   4. Mixed hyperlipidemia    PLAN:    In order of problems listed above:  His planned surgery is elective low to intermediate risk stable CAD optimized exercise tolerance greater than 4 METS he is off his antiplatelet therapy proceed as planned I asked Bauer postoperatively resume clopidogrel as monotherapy when safe from a bleeding perspective usually 48 hours after surgery. Stable CAD having no angina after PCI and current medical therapy continue current treatment including his high intensity statin Stable continue current treatment also on ACE inhibitor beta-blocker he has mild sinus bradycardia would not alter his meds   Next appointment: 6 months   Medication Adjustments/Labs and Tests Ordered: Current medicines are reviewed at length with the patient today.  Concerns regarding medicines are outlined above.  Orders Placed This Encounter  Procedures   EKG 12-Lead   Meds ordered this encounter  Medications   lisinopril (ZESTRIL) 20 MG tablet    Sig: Take 1 tablet (20 mg total) by mouth daily.    Dispense:  90 tablet    Refill:  3    This prescription was filled on 11/25/2019. Any refills authorized will be placed on file.   rosuvastatin (CRESTOR) 5 MG tablet    Sig: Take 1 tablet (5 mg total) by mouth daily.    Dispense:  90 tablet    Refill:  3   carvedilol (COREG) 12.5 MG tablet    Sig: Take 0.5 tablets (6.25 mg total) by mouth 2 (two) times daily with a meal.    Dispense:  90 tablet    Refill:  3   clopidogrel (PLAVIX) 75 MG tablet    Sig: Take 1 tablet (75 mg total) by  mouth daily.    Dispense:  90 tablet    Refill:  3    No chief complaint on file.   History of Present Illness:    Jason Bauer is a 76 y.o. male with a hx of CAD with PCI and stent left anterior descending coronary artery in 2003 hypertension hyperlipidemia right bundle branch block and laparoscopic left radical nephrectomy August 2021 last seen 10/31/2020.  He is pending surgery for peroneal tendon repair under general anesthesia.  Compliance with diet, lifestyle and medications: yes  Is always nice to see Jason Bauer and Jason Bauer and his wife quite well socially He is pending repair tendon right lower extremity by podiatry under general anesthesia He has stable CAD exercise tolerance greater than 4 METS withdrawal his dual antiplatelet therapy and afterwards will transition to monotherapy with clopidogrel He has had no angina dyspnea palpitation He tolerates his statin without muscle pain or weakness [facility] Last lipid profile 10/31/2020 triglycerides 187 cholesterol 153 Past Medical History:  Diagnosis Date   Anginal pain ([place])    Anxiety    Arthritis    Carcinoma of oropharynx ([place]) 07/30/2011   Chronic ischemic heart disease 05/30/2021   Chronic kidney disease    Coronary arteriosclerosis 05/30/2021   Coronary artery disease involving native coronary artery of native  heart 08/01/2015   PCI and stent LAD 2003   Depression    Diverticulosis    Essential (primary) hypertension 08/01/2015   Essential hypertension 08/01/2015   Gastroesophageal reflux disease without esophagitis 05/30/2021   GERD (gastroesophageal reflux disease)    Hearing loss 05/30/2021   Hiatal hernia    Hyperlipidemia 08/01/2015   Hypothyroidism    Insomnia 05/30/2021   Malignant neoplasm of head, face, and neck ([place]) 05/30/2021   Neoplasm of left kidney 12/01/2019   Nonexudative age-related macular degeneration, bilateral, intermediate dry stage 05/30/2021   Obstructive sleep  apnea (adult) (pediatric) 05/30/2021   CPAP   Ocular hypertension, bilateral 05/30/2021   Pain in left knee 05/30/2021   Preoperative cardiovascular examination 08/01/2015   Presbyopia 05/30/2021   PTSD (post-traumatic stress disorder)    Recurrent major depression ([place]) 05/30/2021   Sleep apnea    CPAP    Past Surgical History:  Procedure Laterality Date   ANAL FISTULOTOMY     ARTHROSCOPY KNEE W/ DRILLING Left    CARDIAC CATHETERIZATION  2003   CORONARY ANGIOPLASTY     CYSTOSCOPY/URETEROSCOPY/HOLMIUM LASER/STENT PLACEMENT Left 11/24/2019   Procedure: CYSTOSCOPY/RETROGRADE/URETEROSCOPY/STENT PLACEMENT;  Surgeon: Raynelle Bring, MD;  Location: [facility];  Service: Urology;  Laterality: Left;   HEMORRHOID SURGERY     LAPAROSCOPIC NEPHRECTOMY Left 12/01/2019   Procedure: LAPAROSCOPIC NEPHRECTOMY;  Surgeon: Raynelle Bring, MD;  Location: [facility];  Service: Urology;  Laterality: Left;  ONLY NEEDS 160 MIN   TONSILLECTOMY Left 2006    Current Medications: Current Meds  Medication Sig   levothyroxine (SYNTHROID) 125 MCG tablet Take 125 mcg by mouth daily before breakfast.   Magnesium Oxide 420 MG TABS Take 840 mg by mouth at bedtime.    Nutritional Supplements (JUICE PLUS FIBRE PO) Take 4 tablets by mouth at bedtime. Fruit (2) + Vegetable (2)   Omega-3 Fatty Acids (FISH OIL) 1000 MG CAPS Take 1,000 mg by mouth daily.   pantoprazole (PROTONIX) 40 MG tablet Take 40 mg by mouth daily before breakfast.    sertraline (ZOLOFT) 50 MG tablet Take 50 mg by mouth 2 (two) times daily.   [DISCONTINUED] carvedilol (COREG) 12.5 MG tablet Take 6.25 mg by mouth 2 (two) times daily with a meal.   [DISCONTINUED] lisinopril (ZESTRIL) 20 MG tablet Take 1 tablet (20 mg total) by mouth daily.   [DISCONTINUED] rosuvastatin (CRESTOR) 5 MG tablet Take 1 tablet (5 mg total) by mouth daily.     Allergies:   Patient has no known allergies.   Social History   Socioeconomic History   Marital status: Married    Spouse name:  Not on file   Number of children: Not on file   Years of education: Not on file   Highest education level: Not on file  Occupational History   Not on file  Tobacco Use   Smoking status: Never    Passive exposure: Past   Smokeless tobacco: Never  Vaping Use   Vaping Use: Never used  Substance and Sexual Activity   Alcohol use: Not Currently   Drug use: Never   Sexual activity: Yes  Other Topics Concern   Not on file  Social History Narrative   Not on file   Social Determinants of Health   Financial Resource Strain: Not on file  Food Insecurity: Not on file  Transportation Needs: Not on file  Physical Activity: Not on file  Stress: Not on file  Social Connections: Not on file     Family History:  The patient's family history includes Bladder Cancer in his father; CAD in his father; Cancer in his father; Diabetes in his paternal grandfather; Hypertension in his father; Scoliosis in his mother. ROS:   Please see the history of present illness.    All other systems reviewed and are negative.  EKGs/Labs/Other Studies Reviewed:    The following studies were reviewed today:  EKG:  EKG ordered today and personally reviewed.  The ekg ordered today demonstrates sinus bradycardia 50 bpm otherwise normal  Recent Labs: 10/31/2020: ALT 25; BUN 31; Creatinine, Ser 1.64; Potassium 5.2; Sodium 137  Recent Lipid Panel    Component Value Date/Time   CHOL 153 10/31/2020 1502   TRIG 147 10/31/2020 1502   HDL 55 10/31/2020 1502   CHOLHDL 2.8 10/31/2020 1502   LDLCALC 73 10/31/2020 1502    Physical Exam:    VS:  BP 130/62 (BP Location: Right Arm)    Pulse (!) 50    Ht 5\' 10"  (1.778 m)    Wt 188 lb (85.3 kg)    SpO2 98%    BMI 26.98 kg/m     Wt Readings from Last 3 Encounters:  05/31/21 188 lb (85.3 kg)  10/31/20 181 lb (82.1 kg)  04/12/20 182 lb 12.8 oz (82.9 kg)     GEN:  Well nourished, well developed in no acute distress HEENT: Normal NECK: No JVD; No carotid  bruits LYMPHATICS: No lymphadenopathy CARDIAC: RRR, no murmurs, rubs, gallops RESPIRATORY:  Clear to auscultation without rales, wheezing or rhonchi  ABDOMEN: Soft, non-tender, non-distended MUSCULOSKELETAL:  No edema; No deformity  SKIN: Warm and dry NEUROLOGIC:  Alert and oriented x 3 PSYCHIATRIC:  Normal affect    Signed, Jason More, MD  05/31/2021 10:44 AM    [place]

## 2021-05-31 ENCOUNTER — Encounter: Payer: Self-pay | Admitting: Cardiology

## 2021-05-31 ENCOUNTER — Ambulatory Visit (INDEPENDENT_AMBULATORY_CARE_PROVIDER_SITE_OTHER): Payer: Medicare Other | Admitting: Cardiology

## 2021-05-31 ENCOUNTER — Other Ambulatory Visit: Payer: Self-pay

## 2021-05-31 VITALS — BP 130/62 | HR 50 | Ht 70.0 in | Wt 188.0 lb

## 2021-05-31 DIAGNOSIS — I10 Essential (primary) hypertension: Secondary | ICD-10-CM | POA: Diagnosis not present

## 2021-05-31 DIAGNOSIS — I25118 Atherosclerotic heart disease of native coronary artery with other forms of angina pectoris: Secondary | ICD-10-CM

## 2021-05-31 DIAGNOSIS — E782 Mixed hyperlipidemia: Secondary | ICD-10-CM

## 2021-05-31 DIAGNOSIS — Z01810 Encounter for preprocedural cardiovascular examination: Secondary | ICD-10-CM | POA: Diagnosis not present

## 2021-05-31 MED ORDER — CLOPIDOGREL BISULFATE 75 MG PO TABS: 75.0000 mg | ORAL_TABLET | Freq: Every day | ORAL | 3 refills | Status: AC

## 2021-05-31 MED ORDER — LISINOPRIL 20 MG PO TABS: 20.0000 mg | ORAL_TABLET | Freq: Every day | ORAL | 3 refills | Status: AC

## 2021-05-31 MED ORDER — CARVEDILOL 12.5 MG PO TABS: 6.2500 mg | ORAL_TABLET | Freq: Two times a day (BID) | ORAL | 3 refills | Status: DC

## 2021-05-31 MED ORDER — ROSUVASTATIN CALCIUM 5 MG PO TABS: 5.0000 mg | ORAL_TABLET | Freq: Every day | ORAL | 3 refills | Status: AC

## 2021-05-31 NOTE — Patient Instructions (Addendum)
Medication Instructions:  Your physician recommends that you continue on your current medications as directed. Please refer to the Current Medication list given to you today.  Post-op do not restart your aspirin  *If you need a refill on your cardiac medications before your next appointment, please call your pharmacy*   Lab Work: None If you have labs (blood work) drawn today and your tests are completely normal, you will receive your results only by: [place] (if you have MyChart) OR A paper copy in the mail If you have any lab test that is abnormal or we need to change your treatment, we will call you to review the results.   Testing/Procedures: None   Follow-Up: At [facility], you and your health needs are our priority.  As part of our continuing mission to provide you with exceptional heart care, we have created designated Provider Care Teams.  These Care Teams include your primary Cardiologist (physician) and Advanced Practice Providers (APPs -  Physician Assistants and Nurse Practitioners) who all work together to provide you with the care you need, when you need it.  We recommend signing up for the patient portal called "MyChart".  Sign up information is provided on this After Visit Summary.  MyChart is used to connect with patients for Virtual Visits (Telemedicine).  Patients are able to view lab/test results, encounter notes, upcoming appointments, etc.  Non-urgent messages can be sent to your provider as well.   To learn more about what you can do with MyChart, go to [email or web address].    Your next appointment:   6 month(s)  The format for your next appointment:   In Person  Provider:   Shirlee More, MD    Other Instructions

## 2021-06-03 ENCOUNTER — Encounter: Payer: Medicare Other | Admitting: Podiatry

## 2021-06-05 ENCOUNTER — Other Ambulatory Visit: Payer: Self-pay | Admitting: Podiatry

## 2021-06-05 ENCOUNTER — Encounter: Payer: Self-pay | Admitting: Podiatry

## 2021-06-05 DIAGNOSIS — M79671 Pain in right foot: Secondary | ICD-10-CM | POA: Diagnosis not present

## 2021-06-05 DIAGNOSIS — T148XXA Other injury of unspecified body region, initial encounter: Secondary | ICD-10-CM | POA: Diagnosis not present

## 2021-06-05 DIAGNOSIS — Y929 Unspecified place or not applicable: Secondary | ICD-10-CM | POA: Diagnosis not present

## 2021-06-05 DIAGNOSIS — M7671 Peroneal tendinitis, right leg: Secondary | ICD-10-CM | POA: Diagnosis not present

## 2021-06-05 DIAGNOSIS — S86311A Strain of muscle(s) and tendon(s) of peroneal muscle group at lower leg level, right leg, initial encounter: Secondary | ICD-10-CM | POA: Diagnosis not present

## 2021-06-05 DIAGNOSIS — X58XXXA Exposure to other specified factors, initial encounter: Secondary | ICD-10-CM | POA: Diagnosis not present

## 2021-06-05 MED ORDER — PROMETHAZINE HCL 25 MG PO TABS: 25.0000 mg | ORAL_TABLET | Freq: Three times a day (TID) | ORAL | 0 refills | Status: DC | PRN

## 2021-06-05 MED ORDER — CEPHALEXIN 500 MG PO CAPS: 500.0000 mg | ORAL_CAPSULE | Freq: Three times a day (TID) | ORAL | 0 refills | Status: DC

## 2021-06-05 MED ORDER — OXYCODONE-ACETAMINOPHEN 5-325 MG PO TABS
1.0000 | ORAL_TABLET | Freq: Four times a day (QID) | ORAL | 0 refills | Status: DC | PRN
Start: 2021-06-05 — End: 2021-06-10

## 2021-06-05 NOTE — Progress Notes (Signed)
Postop medications sent 

## 2021-06-06 DIAGNOSIS — Z7902 Long term (current) use of antithrombotics/antiplatelets: Secondary | ICD-10-CM | POA: Diagnosis not present

## 2021-06-06 DIAGNOSIS — M7671 Peroneal tendinitis, right leg: Secondary | ICD-10-CM | POA: Diagnosis not present

## 2021-06-06 DIAGNOSIS — G8918 Other acute postprocedural pain: Secondary | ICD-10-CM | POA: Diagnosis not present

## 2021-06-06 DIAGNOSIS — Z4789 Encounter for other orthopedic aftercare: Secondary | ICD-10-CM | POA: Diagnosis not present

## 2021-06-06 DIAGNOSIS — Z79891 Long term (current) use of opiate analgesic: Secondary | ICD-10-CM | POA: Diagnosis not present

## 2021-06-06 DIAGNOSIS — E785 Hyperlipidemia, unspecified: Secondary | ICD-10-CM | POA: Diagnosis not present

## 2021-06-06 DIAGNOSIS — I10 Essential (primary) hypertension: Secondary | ICD-10-CM | POA: Diagnosis not present

## 2021-06-07 ENCOUNTER — Other Ambulatory Visit: Payer: Self-pay | Admitting: Podiatry

## 2021-06-07 ENCOUNTER — Encounter: Payer: Self-pay | Admitting: Podiatry

## 2021-06-07 NOTE — Progress Notes (Signed)
error 

## 2021-06-10 ENCOUNTER — Other Ambulatory Visit: Payer: Self-pay

## 2021-06-10 ENCOUNTER — Ambulatory Visit (INDEPENDENT_AMBULATORY_CARE_PROVIDER_SITE_OTHER): Payer: Medicare Other | Admitting: Podiatry

## 2021-06-10 ENCOUNTER — Ambulatory Visit: Payer: Medicare Other

## 2021-06-10 ENCOUNTER — Ambulatory Visit (INDEPENDENT_AMBULATORY_CARE_PROVIDER_SITE_OTHER): Payer: Medicare Other

## 2021-06-10 DIAGNOSIS — Z9889 Other specified postprocedural states: Secondary | ICD-10-CM

## 2021-06-10 DIAGNOSIS — S99911A Unspecified injury of right ankle, initial encounter: Secondary | ICD-10-CM | POA: Diagnosis not present

## 2021-06-10 DIAGNOSIS — M7671 Peroneal tendinitis, right leg: Secondary | ICD-10-CM

## 2021-06-10 MED ORDER — OXYCODONE-ACETAMINOPHEN 5-325 MG PO TABS: 1.0000 | ORAL_TABLET | Freq: Four times a day (QID) | ORAL | 0 refills | Status: DC | PRN

## 2021-06-12 DIAGNOSIS — Z79891 Long term (current) use of opiate analgesic: Secondary | ICD-10-CM | POA: Diagnosis not present

## 2021-06-12 DIAGNOSIS — M7671 Peroneal tendinitis, right leg: Secondary | ICD-10-CM | POA: Diagnosis not present

## 2021-06-12 DIAGNOSIS — Z4789 Encounter for other orthopedic aftercare: Secondary | ICD-10-CM | POA: Diagnosis not present

## 2021-06-12 DIAGNOSIS — E785 Hyperlipidemia, unspecified: Secondary | ICD-10-CM | POA: Diagnosis not present

## 2021-06-12 DIAGNOSIS — G8918 Other acute postprocedural pain: Secondary | ICD-10-CM | POA: Diagnosis not present

## 2021-06-12 DIAGNOSIS — I10 Essential (primary) hypertension: Secondary | ICD-10-CM | POA: Diagnosis not present

## 2021-06-13 ENCOUNTER — Encounter: Payer: Medicare Other | Admitting: Podiatry

## 2021-06-14 DIAGNOSIS — Z4789 Encounter for other orthopedic aftercare: Secondary | ICD-10-CM | POA: Diagnosis not present

## 2021-06-14 DIAGNOSIS — M7671 Peroneal tendinitis, right leg: Secondary | ICD-10-CM | POA: Diagnosis not present

## 2021-06-14 DIAGNOSIS — E785 Hyperlipidemia, unspecified: Secondary | ICD-10-CM | POA: Diagnosis not present

## 2021-06-14 DIAGNOSIS — I10 Essential (primary) hypertension: Secondary | ICD-10-CM | POA: Diagnosis not present

## 2021-06-14 DIAGNOSIS — G8918 Other acute postprocedural pain: Secondary | ICD-10-CM | POA: Diagnosis not present

## 2021-06-14 DIAGNOSIS — Z79891 Long term (current) use of opiate analgesic: Secondary | ICD-10-CM | POA: Diagnosis not present

## 2021-06-16 NOTE — Progress Notes (Signed)
Subjective: Jason Bauer is a 76 y.o. is seen today in office s/p right ankle peroneal tendon repair preformed on 06/05/2021.  Pain is improving but pain still present with level 6/10.  Is been nonweightbearing.  Denies any systemic complaints such as fevers, chills, nausea, vomiting. No calf pain, chest pain, shortness of breath.   Objective: General: No acute distress, AAOx3  DP/PT pulses palpable 2/4, CRT < 3 sec to all digits.  Protective sensation intact. Motor function intact.  Right foot: Incision is well coapted without any evidence of dehiscence with sutures, staples intact. There is no surrounding erythema, ascending cellulitis, fluctuance, crepitus, malodor, drainage/purulence. There is mild to moderate edema around the surgical site. There is mild pain along the surgical site.  No other areas of tenderness to bilateral lower extremities.  No other open lesions or pre-ulcerative lesions.  No pain with calf compression, swelling, warmth, erythema.   Assessment and Plan:  Status post right peroneal tendon repair, doing well with no complications   -Treatment options discussed including all alternatives, risks, and complications -X-rays obtained reviewed.  No evidence of acute fracture or foreign body. -Incision was clean.  Small amount of antibiotic ointment was applied followed by dressing.  Keep the dressing clean, dry, intact -Nonweightbearing -CAM boot at all times -Ice/elevation -Pain medication as needed. -Monitor for any clinical signs or symptoms of infection and DVT/PE and directed to call the office immediately should any occur or go to the ER. -Follow-up as scheduled for  or sooner if any problems arise. In the meantime, encouraged to call the office with any questions, concerns, change in symptoms.   Celesta Gentile, DPM

## 2021-06-18 ENCOUNTER — Other Ambulatory Visit: Payer: Self-pay

## 2021-06-18 ENCOUNTER — Ambulatory Visit (INDEPENDENT_AMBULATORY_CARE_PROVIDER_SITE_OTHER): Payer: Medicare Other | Admitting: Podiatry

## 2021-06-18 DIAGNOSIS — Z9889 Other specified postprocedural states: Secondary | ICD-10-CM

## 2021-06-18 DIAGNOSIS — M7671 Peroneal tendinitis, right leg: Secondary | ICD-10-CM

## 2021-06-19 DIAGNOSIS — Z79891 Long term (current) use of opiate analgesic: Secondary | ICD-10-CM | POA: Diagnosis not present

## 2021-06-19 DIAGNOSIS — M7671 Peroneal tendinitis, right leg: Secondary | ICD-10-CM | POA: Diagnosis not present

## 2021-06-19 DIAGNOSIS — G8918 Other acute postprocedural pain: Secondary | ICD-10-CM | POA: Diagnosis not present

## 2021-06-19 DIAGNOSIS — I10 Essential (primary) hypertension: Secondary | ICD-10-CM | POA: Diagnosis not present

## 2021-06-19 DIAGNOSIS — E785 Hyperlipidemia, unspecified: Secondary | ICD-10-CM | POA: Diagnosis not present

## 2021-06-19 DIAGNOSIS — Z4789 Encounter for other orthopedic aftercare: Secondary | ICD-10-CM | POA: Diagnosis not present

## 2021-06-20 ENCOUNTER — Encounter: Payer: Medicare Other | Admitting: Podiatry

## 2021-06-21 DIAGNOSIS — M7671 Peroneal tendinitis, right leg: Secondary | ICD-10-CM | POA: Diagnosis not present

## 2021-06-21 DIAGNOSIS — E785 Hyperlipidemia, unspecified: Secondary | ICD-10-CM | POA: Diagnosis not present

## 2021-06-21 DIAGNOSIS — Z4789 Encounter for other orthopedic aftercare: Secondary | ICD-10-CM | POA: Diagnosis not present

## 2021-06-21 DIAGNOSIS — I10 Essential (primary) hypertension: Secondary | ICD-10-CM | POA: Diagnosis not present

## 2021-06-21 DIAGNOSIS — G8918 Other acute postprocedural pain: Secondary | ICD-10-CM | POA: Diagnosis not present

## 2021-06-21 DIAGNOSIS — Z79891 Long term (current) use of opiate analgesic: Secondary | ICD-10-CM | POA: Diagnosis not present

## 2021-06-23 DIAGNOSIS — M7671 Peroneal tendinitis, right leg: Secondary | ICD-10-CM | POA: Insufficient documentation

## 2021-06-23 HISTORY — DX: Peroneal tendinitis, right leg: M76.71

## 2021-06-23 NOTE — Progress Notes (Signed)
Subjective: Jason Bauer is a 76 y.o. is seen today in office s/p right ankle peroneal tendon repair preformed on 06/05/2021.  He presents here for suture removal.  He states that his pain is improving.  He still nonweightbearing using the cam boot.  Denies any fevers or chills.  No nausea or vomiting.  No chest pain or shortness of breath.   Objective: General: No acute distress, AAOx3  DP/PT pulses palpable 2/4, CRT < 3 sec to all digits.  Protective sensation intact. Motor function intact.  Right foot: Incision is well coapted without any evidence of dehiscence with sutures, staples intact. There is no surrounding erythema, ascending cellulitis, fluctuance, crepitus, malodor, drainage/purulence. There is decreased edema around the surgical site. There is mild but improved pain along the surgical site.  No other areas of tenderness to bilateral lower extremities.  No other open lesions or pre-ulcerative lesions.  No pain with calf compression, swelling, warmth, erythema.   Assessment and Plan:  Status post right peroneal tendon repair, doing well with no complications   -Treatment options discussed including all alternatives, risks, and complications -I remove the sutures today about the staples intact.  Incision was cleaned.  Antibiotic ointment and a bandage applied.  He can keep the dressing clean, dry, intact. -Nonweightbearing -CAM boot at all times -Ice/elevation -Pain medication as needed. -Monitor for any clinical signs or symptoms of infection and DVT/PE and directed to call the office immediately should any occur or go to the ER. -Follow-up as scheduled for staple removal or sooner if any problems arise. In the meantime, encouraged to call the office with any questions, concerns, change in symptoms.   Celesta Gentile, DPM

## 2021-06-27 ENCOUNTER — Encounter: Payer: Medicare Other | Admitting: Podiatry

## 2021-07-04 ENCOUNTER — Encounter: Payer: Medicare Other | Admitting: Podiatry

## 2021-07-05 ENCOUNTER — Ambulatory Visit (INDEPENDENT_AMBULATORY_CARE_PROVIDER_SITE_OTHER): Payer: Medicare Other

## 2021-07-05 ENCOUNTER — Other Ambulatory Visit: Payer: Self-pay

## 2021-07-05 ENCOUNTER — Ambulatory Visit (INDEPENDENT_AMBULATORY_CARE_PROVIDER_SITE_OTHER): Payer: Medicare Other | Admitting: Podiatry

## 2021-07-05 DIAGNOSIS — M7671 Peroneal tendinitis, right leg: Secondary | ICD-10-CM

## 2021-07-05 DIAGNOSIS — Z9889 Other specified postprocedural states: Secondary | ICD-10-CM

## 2021-07-10 NOTE — Progress Notes (Signed)
Subjective: ?Jason Bauer is a 76 y.o. is seen today in office s/p right ankle peroneal tendon repair preformed on 06/05/2021.  States he is doing well but he is ready to get the staples out as are causing discomfort.  Otherwise he has no significant pain.  He is still in the cam boot, nonweightbearing.  He has no other concerns today.  Denies any fevers, chills, nausea, vomiting.  No chest pain, shortness of breath or calf pain. ? ?Objective: ?General: No acute distress, AAOx3  ?DP/PT pulses palpable 2/4, CRT < 3 sec to all digits.  ?Protective sensation intact. Motor function intact.  ?Right foot: Incision is well coapted without any evidence of dehiscence with staples intact.  There is minimal edema present.  There is no erythema or warmth.  No drainage possibly signs of infection and scars forming.  No other areas of discomfort noted. ?No pain with calf compression, swelling, warmth, erythema.  ? ?Assessment and Plan:  ?Status post right peroneal tendon repair, doing well with no complications  ? ?-Treatment options discussed including all alternatives, risks, and complications ?-X-rays were obtained.  No acute fracture.  Staples noted. ?-Remove the staples daily complications.  Incisions healing well.  Small amount of antibiotic ointment and a bandage applied.  Discussed that he can wash the foot with soap and water and apply a similar bandage.  Continue nonweight, ice, elevation as well as compression. ?-Next appointment likely transition to partial weightbearing and start physical therapy. ? ?Trula Slade DPM ? ?

## 2021-07-19 ENCOUNTER — Other Ambulatory Visit: Payer: Self-pay

## 2021-07-19 ENCOUNTER — Ambulatory Visit: Payer: Medicare Other

## 2021-07-19 ENCOUNTER — Ambulatory Visit (INDEPENDENT_AMBULATORY_CARE_PROVIDER_SITE_OTHER): Payer: Medicare Other | Admitting: Podiatry

## 2021-07-19 DIAGNOSIS — Z9889 Other specified postprocedural states: Secondary | ICD-10-CM

## 2021-07-19 DIAGNOSIS — M7671 Peroneal tendinitis, right leg: Secondary | ICD-10-CM

## 2021-07-22 NOTE — Progress Notes (Signed)
Subjective: ?Jason Bauer is a 76 y.o. is seen today in office s/p right ankle peroneal tendon repair preformed on 06/05/2021.  States he has been doing well.  He has been walking in the cam boot.  No significant increase in pain his pain level is 2/10 describes more soreness as opposed to pain.  No injuries or other concerns.  No fevers or chills.   ? ?Objective: ?General: No acute distress, AAOx3 -walking in cam boot ?DP/PT pulses palpable 2/4, CRT < 3 sec to all digits.  ?Protective sensation intact. Motor function intact.  ?Right foot: Incision is well coapted without any evidence of dehiscence and scars forming.  There is no evidence of dehiscence, drainage or any signs of infection.  Minimal tenderness palpation along the peroneal tendon.  Mild edema is also noted.  No other areas of tenderness in this today.  MMT 5/5.  ?No pain with calf compression, swelling, warmth, erythema.  ? ?Assessment and Plan:  ?Status post right peroneal tendon repair, doing well with no complications  ? ?-Treatment options discussed including all alternatives, risks, and complications ?-At this point will refer to physical therapy.  Referral for continued regular physical therapy right knee.  Continue cam boot for now, ice, elevation as well as compression to help with postoperative edema.  As he starts physical therapy consider transition to regular shoe as tolerated. ? ?Return in about 4 weeks (around 08/16/2021). ? ?Trula Slade DPM ?

## 2021-07-26 DIAGNOSIS — R531 Weakness: Secondary | ICD-10-CM | POA: Diagnosis not present

## 2021-07-26 DIAGNOSIS — R2689 Other abnormalities of gait and mobility: Secondary | ICD-10-CM | POA: Diagnosis not present

## 2021-07-26 DIAGNOSIS — M25571 Pain in right ankle and joints of right foot: Secondary | ICD-10-CM | POA: Diagnosis not present

## 2021-07-30 DIAGNOSIS — R531 Weakness: Secondary | ICD-10-CM | POA: Diagnosis not present

## 2021-07-30 DIAGNOSIS — M25571 Pain in right ankle and joints of right foot: Secondary | ICD-10-CM | POA: Diagnosis not present

## 2021-07-30 DIAGNOSIS — R2689 Other abnormalities of gait and mobility: Secondary | ICD-10-CM | POA: Diagnosis not present

## 2021-08-01 DIAGNOSIS — R531 Weakness: Secondary | ICD-10-CM | POA: Diagnosis not present

## 2021-08-01 DIAGNOSIS — M25571 Pain in right ankle and joints of right foot: Secondary | ICD-10-CM | POA: Diagnosis not present

## 2021-08-01 DIAGNOSIS — R2689 Other abnormalities of gait and mobility: Secondary | ICD-10-CM | POA: Diagnosis not present

## 2021-08-06 DIAGNOSIS — R531 Weakness: Secondary | ICD-10-CM | POA: Diagnosis not present

## 2021-08-06 DIAGNOSIS — R2689 Other abnormalities of gait and mobility: Secondary | ICD-10-CM | POA: Diagnosis not present

## 2021-08-06 DIAGNOSIS — M25571 Pain in right ankle and joints of right foot: Secondary | ICD-10-CM | POA: Diagnosis not present

## 2021-08-13 DIAGNOSIS — R531 Weakness: Secondary | ICD-10-CM | POA: Diagnosis not present

## 2021-08-13 DIAGNOSIS — R2689 Other abnormalities of gait and mobility: Secondary | ICD-10-CM | POA: Diagnosis not present

## 2021-08-13 DIAGNOSIS — M25571 Pain in right ankle and joints of right foot: Secondary | ICD-10-CM | POA: Diagnosis not present

## 2021-08-15 DIAGNOSIS — R531 Weakness: Secondary | ICD-10-CM | POA: Diagnosis not present

## 2021-08-15 DIAGNOSIS — M25571 Pain in right ankle and joints of right foot: Secondary | ICD-10-CM | POA: Diagnosis not present

## 2021-08-15 DIAGNOSIS — R2689 Other abnormalities of gait and mobility: Secondary | ICD-10-CM | POA: Diagnosis not present

## 2021-08-16 ENCOUNTER — Encounter: Payer: Self-pay | Admitting: Podiatry

## 2021-08-16 ENCOUNTER — Ambulatory Visit (INDEPENDENT_AMBULATORY_CARE_PROVIDER_SITE_OTHER): Payer: Medicare Other | Admitting: Podiatry

## 2021-08-16 DIAGNOSIS — Z9889 Other specified postprocedural states: Secondary | ICD-10-CM

## 2021-08-16 DIAGNOSIS — M7671 Peroneal tendinitis, right leg: Secondary | ICD-10-CM

## 2021-08-19 DIAGNOSIS — R531 Weakness: Secondary | ICD-10-CM | POA: Diagnosis not present

## 2021-08-19 DIAGNOSIS — M25571 Pain in right ankle and joints of right foot: Secondary | ICD-10-CM | POA: Diagnosis not present

## 2021-08-19 DIAGNOSIS — R2689 Other abnormalities of gait and mobility: Secondary | ICD-10-CM | POA: Diagnosis not present

## 2021-08-19 NOTE — Progress Notes (Signed)
Subjective: ?Jason Bauer is a 76 y.o. is seen today in office s/p right ankle peroneal tendon repair preformed on 06/05/2021.  States he has been doing better.  Been doing physical therapy and is back to wearing regular shoe.  They have been putting KT tape on the ankle which has been helping.  Overall he feels he is doing better and is continue to improve.  No recent injury or change otherwise.  No fevers or chills.  No other concerns.  ? ?Objective: ?General: No acute distress, AAOx3 -walking in cam boot ?DP/PT pulses palpable 2/4, CRT < 3 sec to all digits.  ?Protective sensation intact. Motor function intact.  ?Right foot: Incision is well coapted without any evidence of dehiscence.  I was not able to fully evaluate the incision as he has tape on that he did not have removed.  He states the incisions are healing well no issues.  Minimal tenderness on the surgical sites.  Trace edema.  There is no erythema or warmth.  No other areas of discomfort noted.  ?No pain with calf compression, swelling, warmth, erythema.  ? ?Assessment and Plan:  ?Status post right peroneal tendon repair, doing well with no complications  ? ?-Treatment options discussed including all alternatives, risks, and complications ?-Running continue physical therapy.  Gradually increase activity level as tolerated.  Continue ice, elevate as well as compression of any postoperative edema.  He has an upcoming trip planned next week and discussed wearing an ankle brace to use as needed as well as icing and trying to limit activity although [place] is [place] be [place] of the country. ? ?Trula Slade DPM ? ?

## 2021-09-03 DIAGNOSIS — R2689 Other abnormalities of gait and mobility: Secondary | ICD-10-CM | POA: Diagnosis not present

## 2021-09-03 DIAGNOSIS — R531 Weakness: Secondary | ICD-10-CM | POA: Diagnosis not present

## 2021-09-03 DIAGNOSIS — M25571 Pain in right ankle and joints of right foot: Secondary | ICD-10-CM | POA: Diagnosis not present

## 2021-09-05 DIAGNOSIS — R531 Weakness: Secondary | ICD-10-CM | POA: Diagnosis not present

## 2021-09-05 DIAGNOSIS — R2689 Other abnormalities of gait and mobility: Secondary | ICD-10-CM | POA: Diagnosis not present

## 2021-09-05 DIAGNOSIS — M25571 Pain in right ankle and joints of right foot: Secondary | ICD-10-CM | POA: Diagnosis not present

## 2021-09-10 DIAGNOSIS — R2689 Other abnormalities of gait and mobility: Secondary | ICD-10-CM | POA: Diagnosis not present

## 2021-09-10 DIAGNOSIS — R531 Weakness: Secondary | ICD-10-CM | POA: Diagnosis not present

## 2021-09-10 DIAGNOSIS — M25571 Pain in right ankle and joints of right foot: Secondary | ICD-10-CM | POA: Diagnosis not present

## 2021-10-02 DIAGNOSIS — N400 Enlarged prostate without lower urinary tract symptoms: Secondary | ICD-10-CM | POA: Diagnosis not present

## 2021-10-02 DIAGNOSIS — J8410 Pulmonary fibrosis, unspecified: Secondary | ICD-10-CM | POA: Diagnosis not present

## 2021-10-02 DIAGNOSIS — C641 Malignant neoplasm of right kidney, except renal pelvis: Secondary | ICD-10-CM | POA: Diagnosis not present

## 2021-10-02 DIAGNOSIS — J984 Other disorders of lung: Secondary | ICD-10-CM | POA: Diagnosis not present

## 2021-10-02 DIAGNOSIS — N281 Cyst of kidney, acquired: Secondary | ICD-10-CM | POA: Diagnosis not present

## 2021-10-02 DIAGNOSIS — K862 Cyst of pancreas: Secondary | ICD-10-CM | POA: Diagnosis not present

## 2021-10-02 DIAGNOSIS — C642 Malignant neoplasm of left kidney, except renal pelvis: Secondary | ICD-10-CM | POA: Diagnosis not present

## 2021-10-02 DIAGNOSIS — I700 Atherosclerosis of aorta: Secondary | ICD-10-CM | POA: Diagnosis not present

## 2021-10-09 DIAGNOSIS — Z85528 Personal history of other malignant neoplasm of kidney: Secondary | ICD-10-CM | POA: Diagnosis not present

## 2021-11-11 ENCOUNTER — Ambulatory Visit (INDEPENDENT_AMBULATORY_CARE_PROVIDER_SITE_OTHER): Payer: Medicare Other | Admitting: Podiatry

## 2021-11-11 ENCOUNTER — Ambulatory Visit (INDEPENDENT_AMBULATORY_CARE_PROVIDER_SITE_OTHER): Payer: Medicare Other

## 2021-11-11 DIAGNOSIS — I25118 Atherosclerotic heart disease of native coronary artery with other forms of angina pectoris: Secondary | ICD-10-CM | POA: Diagnosis not present

## 2021-11-11 DIAGNOSIS — M7671 Peroneal tendinitis, right leg: Secondary | ICD-10-CM

## 2021-11-11 NOTE — Progress Notes (Unsigned)
Subjective: Walking up incline felt a pop on anterior lateral ankle, getting better. This was about 2 months ago. No pain on the surgery area. Had some sharp pain but that resolved.  Denies any systemic complaints such as fevers, chills, nausea, vomiting. No acute changes since last appointment, and no other complaints at this time.   Objective: AAO x3, NAD DP/PT pulses palpable bilaterally, CRT less than 3 seconds Protective sensation intact with Simms Weinstein monofilament, vibratory sensation intact, Achilles tendon reflex intact No areas of pinpoint bony tenderness or pain with vibratory sensation. MMT 5/5, ROM WNL. No edema, erythema, increase in warmth to bilateral lower extremities.  No open lesions or pre-ulcerative lesions.  No pain with calf compression, swelling, warmth, erythema  Assessment:  Plan: -All treatment options discussed with the patient including all alternatives, risks, complications.   -Patient encouraged to call the office with any questions, concerns, change in symptoms.

## 2021-11-13 DIAGNOSIS — S060X0A Concussion without loss of consciousness, initial encounter: Secondary | ICD-10-CM | POA: Diagnosis not present

## 2021-11-13 DIAGNOSIS — R58 Hemorrhage, not elsewhere classified: Secondary | ICD-10-CM | POA: Diagnosis not present

## 2021-11-13 DIAGNOSIS — W19XXXA Unspecified fall, initial encounter: Secondary | ICD-10-CM | POA: Diagnosis not present

## 2021-11-13 DIAGNOSIS — I959 Hypotension, unspecified: Secondary | ICD-10-CM | POA: Diagnosis not present

## 2021-11-13 DIAGNOSIS — S0990XA Unspecified injury of head, initial encounter: Secondary | ICD-10-CM | POA: Diagnosis not present

## 2021-11-13 DIAGNOSIS — S50312A Abrasion of left elbow, initial encounter: Secondary | ICD-10-CM | POA: Diagnosis not present

## 2021-11-28 ENCOUNTER — Encounter: Payer: Self-pay | Admitting: Cardiology

## 2021-11-28 ENCOUNTER — Ambulatory Visit (INDEPENDENT_AMBULATORY_CARE_PROVIDER_SITE_OTHER): Payer: Medicare Other | Admitting: Cardiology

## 2021-11-28 VITALS — BP 140/68 | HR 52 | Ht 70.0 in | Wt 183.0 lb

## 2021-11-28 DIAGNOSIS — I25118 Atherosclerotic heart disease of native coronary artery with other forms of angina pectoris: Secondary | ICD-10-CM | POA: Diagnosis not present

## 2021-11-28 DIAGNOSIS — I10 Essential (primary) hypertension: Secondary | ICD-10-CM

## 2021-11-28 DIAGNOSIS — E782 Mixed hyperlipidemia: Secondary | ICD-10-CM

## 2021-11-28 DIAGNOSIS — R55 Syncope and collapse: Secondary | ICD-10-CM | POA: Diagnosis not present

## 2021-11-28 NOTE — Progress Notes (Signed)
Cardiology Office Note:    Date:  11/28/2021   ID:  Jason Bauer, DOB 30-Dec-1945, MRN [id number]  PCP:  Street, Jason Mt, MD  Cardiologist:  Shirlee More, MD    Referring MD: [address], Jason Bauer, *    ASSESSMENT:    1. Syncope and collapse   2. Essential hypertension   3. Coronary artery disease of native artery of native heart with stable angina pectoris ([place])   4. Mixed hyperlipidemia    PLAN:    In order of problems listed above:  We will ask him to trend record his blood pressure sitting and standing for 1 month with good technique and make a decision about altering his medications.  In the interim he will use caution shifting posture quickly and has a urinal to use at the bedside at nighttime continue his beta-blocker ACE inhibitor Stable CAD no angina continue medical therapy clopidogrel high intensity statin beta-blocker Continue his high intensity statin LDL at target especially with aortic atherosclerosis noted on CT scan   Next appointment: 6 months   Medication Adjustments/Labs and Tests Ordered: Current medicines are reviewed at length with the patient today.  Concerns regarding medicines are outlined above.  No orders of the defined types were placed in this encounter.  No orders of the defined types were placed in this encounter.   Chief Complaint  Patient presents with   Follow-up  For CAD he was recently in the ED  History of Present Illness:    Jason Bauer is a 76 y.o. male with a hx of CAD with PCI and stent left anterior descending coronary artery in 2003 hypertension hyperlipidemia right bundle branch block and laparoscopic left radical nephrectomy August 2021  last seen 05/31/2021 and preoperative evaluation prior to peroneal tendon repair under general anesthesia.  He was seen at [facility] 11/13/2021 after a fall CBC showed a hemoglobin of 13.2 creatinine 1.30 potassium 4.0 troponin was assessed in the ED undetectable.   He had a CT of his head without contrast that showed no acute injury.  He also had an EKG performed showing sinus rhythm right bundle branch block otherwise normal.  Compliance with diet, lifestyle and medications: Yes  He notices some lightheadedness when he shifts posture at times his home blood pressures in the range of 814 systolic He has good healthcare knowledge educated him and good technique for checking blood pressure and he will trend his blood pressures at home sitting and standing for a month and send a copy to me at this time with most measurements in the range of 1 48-1 40 systolic am hesitant to reduce his antihypertensive therapy. In retrospect he is unsure whether he lost consciousness first or struck his head first.  He tells me he is felt to have a concussion when he was in the ED Otherwise no chest pain shortness of breath edema or palpitations. Recent labs [facility] cholesterol 148 LDL 60 HDL 66 triglycerides 109 A1c normal 5.9%. He also had aortic atherosclerosis on a recent urologic CT scan Past Medical History:  Diagnosis Date   Anginal pain ([place])    Anxiety    Arthritis    Carcinoma of oropharynx ([place]) 07/30/2011   Chronic ischemic heart disease 05/30/2021   Chronic kidney disease    Coronary arteriosclerosis 05/30/2021   Coronary artery disease involving native coronary artery of native heart 08/01/2015   PCI and stent LAD 2003   Depression    Diverticulosis    Essential (primary)  hypertension 08/01/2015   Essential hypertension 08/01/2015   Gastroesophageal reflux disease without esophagitis 05/30/2021   GERD (gastroesophageal reflux disease)    Hearing loss 05/30/2021   Hiatal hernia    Hyperlipidemia 08/01/2015   Hypothyroidism    Insomnia 05/30/2021   Malignant neoplasm of head, face, and neck ([place]) 05/30/2021   Neoplasm of left kidney 12/01/2019   Nonexudative age-related macular degeneration, bilateral, intermediate dry stage 05/30/2021   Obstructive sleep apnea  (adult) (pediatric) 05/30/2021   CPAP   Ocular hypertension, bilateral 05/30/2021   Pain in left knee 05/30/2021   Preoperative cardiovascular examination 08/01/2015   Presbyopia 05/30/2021   PTSD (post-traumatic stress disorder)    Recurrent major depression ([place]) 05/30/2021   Sleep apnea    CPAP    Past Surgical History:  Procedure Laterality Date   ANAL FISTULOTOMY     ARTHROSCOPY KNEE W/ DRILLING Left    CARDIAC CATHETERIZATION  2003   CORONARY ANGIOPLASTY     CYSTOSCOPY/URETEROSCOPY/HOLMIUM LASER/STENT PLACEMENT Left 11/24/2019   Procedure: CYSTOSCOPY/RETROGRADE/URETEROSCOPY/STENT PLACEMENT;  Surgeon: Raynelle Bring, MD;  Location: [facility];  Service: Urology;  Laterality: Left;   HEMORRHOID SURGERY     LAPAROSCOPIC NEPHRECTOMY Left 12/01/2019   Procedure: LAPAROSCOPIC NEPHRECTOMY;  Surgeon: Raynelle Bring, MD;  Location: [facility];  Service: Urology;  Laterality: Left;  ONLY NEEDS 160 MIN   TONSILLECTOMY Left 2006    Current Medications: Current Meds  Medication Sig   carvedilol (COREG) 12.5 MG tablet Take 0.5 tablets (6.25 mg total) by mouth 2 (two) times daily with a meal.   cholecalciferol (VITAMIN D3) 10 MCG (400 UNIT) TABS tablet Take 400 Units by mouth daily.   clopidogrel (PLAVIX) 75 MG tablet Take 1 tablet (75 mg total) by mouth daily.   Coenzyme Q10 (CO Q 10) 100 MG CAPS Take 100 mg by mouth daily.   levothyroxine (SYNTHROID) 112 MCG tablet Take 112 mcg by mouth daily before breakfast.   lisinopril (ZESTRIL) 20 MG tablet Take 1 tablet (20 mg total) by mouth daily.   Magnesium Oxide 420 MG TABS Take 840 mg by mouth at bedtime.    Nutritional Supplements (JUICE PLUS FIBRE PO) Take 4 tablets by mouth at bedtime. Fruit (2) + Vegetable (2)   Omega-3 Fatty Acids (FISH OIL) 1000 MG CAPS Take 1,000 mg by mouth daily.   pantoprazole (PROTONIX) 40 MG tablet Take 40 mg by mouth daily before breakfast.    rosuvastatin (CRESTOR) 5 MG tablet Take 1 tablet (5 mg total) by mouth daily.    sertraline (ZOLOFT) 50 MG tablet Take 50 mg by mouth 2 (two) times daily.     Allergies:   Patient has no known allergies.   Social History   Socioeconomic History   Marital status: Married    Spouse name: Not on file   Number of children: Not on file   Years of education: Not on file   Highest education level: Not on file  Occupational History   Not on file  Tobacco Use   Smoking status: Never    Passive exposure: Past   Smokeless tobacco: Never  Vaping Use   Vaping Use: Never used  Substance and Sexual Activity   Alcohol use: Not Currently   Drug use: Never   Sexual activity: Yes  Other Topics Concern   Not on file  Social History Narrative   Not on file   Social Determinants of Health   Financial Resource Strain: Not on file  Food Insecurity: Not on file  Transportation Needs: Not on file  Physical Activity: Not on file  Stress: Not on file  Social Connections: Not on file     Family History: The patient's family history includes Bladder Cancer in his father; CAD in his father; Cancer in his father; Diabetes in his paternal grandfather; Hypertension in his father; Scoliosis in his mother. ROS:   Please see the history of present illness.    All other systems reviewed and are negative.  EKGs/Labs/Other Studies Reviewed:    The following studies were reviewed today:    Recent Labs: See history Recent Lipid Panel    Component Value Date/Time   CHOL 153 10/31/2020 1502   TRIG 147 10/31/2020 1502   HDL 55 10/31/2020 1502   CHOLHDL 2.8 10/31/2020 1502   LDLCALC 73 10/31/2020 1502    Physical Exam:    VS:  BP (!) 140/68 (BP Location: Left Arm, Patient Position: Sitting, Cuff Size: Normal)   Pulse (!) 52   Ht '5\' 10"'$  (1.778 m)   Wt 183 lb (83 kg)   SpO2 98%   BMI 26.26 kg/m     Wt Readings from Last 3 Encounters:  11/28/21 183 lb (83 kg)  05/31/21 188 lb (85.3 kg)  10/31/20 181 lb (82.1 kg)     GEN:  Well nourished, well developed in no acute  distress HEENT: Normal NECK: No JVD; No carotid bruits LYMPHATICS: No lymphadenopathy CARDIAC: RRR, no murmurs, rubs, gallops RESPIRATORY:  Clear to auscultation without rales, wheezing or rhonchi  ABDOMEN: Soft, non-tender, non-distended MUSCULOSKELETAL:  No edema; No deformity  SKIN: Warm and dry NEUROLOGIC:  Alert and oriented x 3 PSYCHIATRIC:  Normal affect    Signed, Shirlee More, MD  11/28/2021 2:55 PM    [facility]

## 2021-11-28 NOTE — Patient Instructions (Signed)
Medication Instructions:  Your physician recommends that you continue on your current medications as directed. Please refer to the Current Medication list given to you today.  *If you need a refill on your cardiac medications before your next appointment, please call your pharmacy*   Lab Work: None If you have labs (blood work) drawn today and your tests are completely normal, you will receive your results only by: [place] (if you have MyChart) OR A paper copy in the mail If you have any lab test that is abnormal or we need to change your treatment, we will call you to review the results.   Testing/Procedures: None   Follow-Up: At [facility], you and your health needs are our priority.  As part of our continuing mission to provide you with exceptional heart care, we have created designated Provider Care Teams.  These Care Teams include your primary Cardiologist (physician) and Advanced Practice Providers (APPs -  Physician Assistants and Nurse Practitioners) who all work together to provide you with the care you need, when you need it.  We recommend signing up for the patient portal called "MyChart".  Sign up information is provided on this After Visit Summary.  MyChart is used to connect with patients for Virtual Visits (Telemedicine).  Patients are able to view lab/test results, encounter notes, upcoming appointments, etc.  Non-urgent messages can be sent to your provider as well.   To learn more about what you can do with MyChart, go to [email or web address].    Your next appointment:   6 month(s)  The format for your next appointment:   In Person  Provider:   Shirlee More, MD    Other Instructions Check blood pressure daily sitting and standing. Send Dr. Bettina Gavia a list via [place] in a few weeks of your blood pressures  Important Information About Sugar

## 2022-02-07 DIAGNOSIS — Z23 Encounter for immunization: Secondary | ICD-10-CM | POA: Diagnosis not present

## 2022-02-28 DIAGNOSIS — J209 Acute bronchitis, unspecified: Secondary | ICD-10-CM | POA: Diagnosis not present

## 2022-02-28 DIAGNOSIS — J069 Acute upper respiratory infection, unspecified: Secondary | ICD-10-CM | POA: Diagnosis not present

## 2022-02-28 DIAGNOSIS — R0981 Nasal congestion: Secondary | ICD-10-CM | POA: Diagnosis not present

## 2022-02-28 DIAGNOSIS — R059 Cough, unspecified: Secondary | ICD-10-CM | POA: Diagnosis not present

## 2022-03-12 DIAGNOSIS — Z23 Encounter for immunization: Secondary | ICD-10-CM | POA: Diagnosis not present

## 2022-03-31 DIAGNOSIS — L820 Inflamed seborrheic keratosis: Secondary | ICD-10-CM | POA: Diagnosis not present

## 2022-03-31 DIAGNOSIS — L578 Other skin changes due to chronic exposure to nonionizing radiation: Secondary | ICD-10-CM | POA: Diagnosis not present

## 2022-03-31 DIAGNOSIS — B351 Tinea unguium: Secondary | ICD-10-CM | POA: Diagnosis not present

## 2022-04-10 DIAGNOSIS — W19XXXA Unspecified fall, initial encounter: Secondary | ICD-10-CM | POA: Diagnosis not present

## 2022-04-10 DIAGNOSIS — R9431 Abnormal electrocardiogram [ECG] [EKG]: Secondary | ICD-10-CM | POA: Diagnosis not present

## 2022-04-10 DIAGNOSIS — I959 Hypotension, unspecified: Secondary | ICD-10-CM | POA: Diagnosis not present

## 2022-04-10 DIAGNOSIS — R55 Syncope and collapse: Secondary | ICD-10-CM | POA: Diagnosis not present

## 2022-04-10 DIAGNOSIS — Z955 Presence of coronary angioplasty implant and graft: Secondary | ICD-10-CM | POA: Diagnosis not present

## 2022-04-10 DIAGNOSIS — R42 Dizziness and giddiness: Secondary | ICD-10-CM | POA: Diagnosis not present

## 2022-04-10 DIAGNOSIS — I10 Essential (primary) hypertension: Secondary | ICD-10-CM | POA: Diagnosis not present

## 2022-04-23 DIAGNOSIS — K8689 Other specified diseases of pancreas: Secondary | ICD-10-CM | POA: Diagnosis not present

## 2022-04-23 DIAGNOSIS — I2510 Atherosclerotic heart disease of native coronary artery without angina pectoris: Secondary | ICD-10-CM | POA: Diagnosis not present

## 2022-04-23 DIAGNOSIS — Z85528 Personal history of other malignant neoplasm of kidney: Secondary | ICD-10-CM | POA: Diagnosis not present

## 2022-04-23 DIAGNOSIS — C642 Malignant neoplasm of left kidney, except renal pelvis: Secondary | ICD-10-CM | POA: Diagnosis not present

## 2022-04-23 DIAGNOSIS — K5730 Diverticulosis of large intestine without perforation or abscess without bleeding: Secondary | ICD-10-CM | POA: Diagnosis not present

## 2022-04-23 DIAGNOSIS — K839 Disease of biliary tract, unspecified: Secondary | ICD-10-CM | POA: Diagnosis not present

## 2022-04-23 DIAGNOSIS — I700 Atherosclerosis of aorta: Secondary | ICD-10-CM | POA: Diagnosis not present

## 2022-05-27 NOTE — Progress Notes (Unsigned)
Cardiology Office Note:    Date:  05/28/2022   ID:  Jason Bauer, DOB 08/14/1945, MRN [id number]  PCP:  Street, Sharon Mt, MD  Cardiologist:  Shirlee More, MD    Referring MD: [address] Sharon Mt, *    ASSESSMENT:    1. Syncope and collapse   2. Sinus bradycardia   3. RBBB   4. Coronary artery disease of native artery of native heart with stable angina pectoris ([place])   5. Essential hypertension    PLAN:    In order of problems listed above:  Interval syncopal episode very typical vasovagal in the bathroom with defecation quick recovery I think influenced by his labile hypertension orthostatic drop in blood pressure and relative bradycardia Utilizing his home blood pressures only and then reduce carvedilol to once daily I will ask in 2 weeks to send me a list of his blood pressure sitting and standing and I may decrease his ACE inhibitor at that time Another episode of syncope I told him to remain on the floor and have his wife increase his Lasix to mitigate the episode Certainly is at risk 21 years out from his PCI and stent and is unable to use a treadmill with his ankle surgery and have a pharmacologic perfusion study done in my office Continue his current medical treatment including clopidogrel and high intensity statin   Next appointment: 6 months   Medication Adjustments/Labs and Tests Ordered: Current medicines are reviewed at length with the patient today.  Concerns regarding medicines are outlined above.  No orders of the defined types were placed in this encounter.  No orders of the defined types were placed in this encounter.   Chief Complaint  Patient presents with   Loss of Consciousness    History of Present Illness:    Jason Bauer is a 77 y.o. male with a hx of CAD with PCI and stent left anterior descending coronary artery in 2003 hypertension hyperlipidemia right bundle branch block and laparoscopic left radical nephrectomy August  2021 last seen 11/28/2021.  He was seen at [facility] emergency room 04/10/2022 for an episode of brief syncope in the bathroom to move his bowel.  In the emergency room his blood pressure initially recorded 206/90 4 repeat 148/71 heart rate 56 and 50 bpm.  Hemoglobin normal at 13.0 creatinine 1.4 sodium one 3.8 GFR 49 cc/min chest x-ray showed no cardiopulmonary disease his EKG did show sinus bradycardia 50 bpm and right bundle branch block.  Compliance with diet, lifestyle and medications: Yes  He has very labile hypertension is very hypertensive when he goes to the [place] or seen in my office Uses good technique he has a validated [employer] device and home blood pressures are very well-controlled generally in the range of 1 08-6 30 systolic 57-84 diastolic but he also gets numbers at home that are low down to 101/59 and has an orthostatic drop in blood pressure at home of up to 20 mmHg He was in the bathroom felt lightheaded did not want to be injured so he placed himself down on the ground when he stood up to get down on the floor he had a brief syncopal episode He quickly recovered Associated chest pain edema shortness of breath He is less active than previous after ankle surgery He has no chest pain edema shortness of breath or palpitations but Past Medical History:  Diagnosis Date   Anginal pain ([place])    Anxiety    Arthritis  Carcinoma of oropharynx ([place]) 07/30/2011   Chronic ischemic heart disease 05/30/2021   Chronic kidney disease    Coronary arteriosclerosis 05/30/2021   Coronary artery disease involving native coronary artery of native heart 08/01/2015   PCI and stent LAD 2003   Depression    Diverticulosis    Essential (primary) hypertension 08/01/2015   Essential hypertension 08/01/2015   Gastroesophageal reflux disease without esophagitis 05/30/2021   GERD (gastroesophageal reflux disease)    Hearing loss 05/30/2021   Hiatal hernia    Hyperlipidemia 08/01/2015   Hypothyroidism     Insomnia 05/30/2021   Malignant neoplasm of head, face, and neck ([place]) 05/30/2021   Neoplasm of left kidney 12/01/2019   Nonexudative age-related macular degeneration, bilateral, intermediate dry stage 05/30/2021   Obstructive sleep apnea (adult) (pediatric) 05/30/2021   CPAP   Ocular hypertension, bilateral 05/30/2021   Pain in left knee 05/30/2021   Preoperative cardiovascular examination 08/01/2015   Presbyopia 05/30/2021   PTSD (post-traumatic stress disorder)    Recurrent major depression ([place]) 05/30/2021   Sleep apnea    CPAP    Past Surgical History:  Procedure Laterality Date   ANAL FISTULOTOMY     ARTHROSCOPY KNEE W/ DRILLING Left    CARDIAC CATHETERIZATION  2003   CORONARY ANGIOPLASTY     CYSTOSCOPY/URETEROSCOPY/HOLMIUM LASER/STENT PLACEMENT Left 11/24/2019   Procedure: CYSTOSCOPY/RETROGRADE/URETEROSCOPY/STENT PLACEMENT;  Surgeon: Raynelle Bring, MD;  Location: [facility];  Service: Urology;  Laterality: Left;   HEMORRHOID SURGERY     LAPAROSCOPIC NEPHRECTOMY Left 12/01/2019   Procedure: LAPAROSCOPIC NEPHRECTOMY;  Surgeon: Raynelle Bring, MD;  Location: [facility];  Service: Urology;  Laterality: Left;  ONLY NEEDS 160 MIN   TONSILLECTOMY Left 2006    Current Medications: Current Meds  Medication Sig   carvedilol (COREG) 12.5 MG tablet Take 0.5 tablets (6.25 mg total) by mouth 2 (two) times daily with a meal.   cholecalciferol (VITAMIN D3) 10 MCG (400 UNIT) TABS tablet Take 400 Units by mouth daily.   clopidogrel (PLAVIX) 75 MG tablet Take 1 tablet (75 mg total) by mouth daily.   Coenzyme Q10 (CO Q 10) 100 MG CAPS Take 100 mg by mouth daily.   levothyroxine (SYNTHROID) 112 MCG tablet Take 112 mcg by mouth daily before breakfast.   lisinopril (ZESTRIL) 20 MG tablet Take 1 tablet (20 mg total) by mouth daily.   Magnesium Oxide 420 MG TABS Take 840 mg by mouth at bedtime.    Nutritional Supplements (JUICE PLUS FIBRE PO) Take 4 tablets by mouth at bedtime. Fruit (2) + Vegetable (2)   Omega-3  Fatty Acids (FISH OIL) 1000 MG CAPS Take 1,000 mg by mouth daily.   pantoprazole (PROTONIX) 40 MG tablet Take 40 mg by mouth daily before breakfast.    rosuvastatin (CRESTOR) 5 MG tablet Take 1 tablet (5 mg total) by mouth daily.   sertraline (ZOLOFT) 50 MG tablet Take 50 mg by mouth 2 (two) times daily.     Allergies:   Patient has no known allergies.   Social History   Socioeconomic History   Marital status: Married    Spouse name: Not on file   Number of children: Not on file   Years of education: Not on file   Highest education level: Not on file  Occupational History   Not on file  Tobacco Use   Smoking status: Never    Passive exposure: Past   Smokeless tobacco: Never  Vaping Use   Vaping Use: Never used  Substance and Sexual Activity  Alcohol use: Not Currently   Drug use: Never   Sexual activity: Yes  Other Topics Concern   Not on file  Social History Narrative   Not on file   Social Determinants of Health   Financial Resource Strain: Not on file  Food Insecurity: Not on file  Transportation Needs: Not on file  Physical Activity: Not on file  Stress: Not on file  Social Connections: Not on file     Family History: The patient's family history includes Bladder Cancer in his father; CAD in his father; Cancer in his father; Diabetes in his paternal grandfather; Hypertension in his father; Scoliosis in his mother. ROS:   Please see the history of present illness.    All other systems reviewed and are negative.  EKGs/Labs/Other Studies Reviewed:    The following studies were reviewed today:  See history  Recent Labs: No results found for requested labs within last 365 days.  Recent Lipid Panel    Component Value Date/Time   CHOL 153 10/31/2020 1502   TRIG 147 10/31/2020 1502   HDL 55 10/31/2020 1502   CHOLHDL 2.8 10/31/2020 1502   LDLCALC 73 10/31/2020 1502    Physical Exam:    VS:  BP (!) 190/80 (BP Location: Left Arm, Patient Position:  Sitting)   Pulse 60   Ht '5\' 10"'$  (1.778 m)   Wt 181 lb 1.6 oz (82.1 kg)   SpO2 99%   BMI 25.99 kg/m     Wt Readings from Last 3 Encounters:  05/28/22 181 lb 1.6 oz (82.1 kg)  11/28/21 183 lb (83 kg)  05/31/21 188 lb (85.3 kg)    Repeat blood pressure by me 162/80 GEN:  Well nourished, well developed in no acute distress HEENT: Normal NECK: No JVD; No carotid bruits LYMPHATICS: No lymphadenopathy CARDIAC: RRR, no murmurs, rubs, gallops RESPIRATORY:  Clear to auscultation without rales, wheezing or rhonchi  ABDOMEN: Soft, non-tender, non-distended MUSCULOSKELETAL:  No edema; No deformity  SKIN: Warm and dry NEUROLOGIC:  Alert and oriented x 3 PSYCHIATRIC:  Normal affect    Signed, Shirlee More, MD  05/28/2022 3:36 PM    [facility]

## 2022-05-28 ENCOUNTER — Encounter: Payer: Self-pay | Admitting: Cardiology

## 2022-05-28 ENCOUNTER — Ambulatory Visit: Payer: Medicare Other | Attending: Cardiology | Admitting: Cardiology

## 2022-05-28 ENCOUNTER — Telehealth (HOSPITAL_COMMUNITY): Payer: Self-pay | Admitting: *Deleted

## 2022-05-28 VITALS — BP 150/78 | HR 60 | Ht 70.0 in | Wt 181.1 lb

## 2022-05-28 DIAGNOSIS — R001 Bradycardia, unspecified: Secondary | ICD-10-CM | POA: Insufficient documentation

## 2022-05-28 DIAGNOSIS — I4510 Unspecified right bundle-branch block: Secondary | ICD-10-CM | POA: Insufficient documentation

## 2022-05-28 DIAGNOSIS — I25118 Atherosclerotic heart disease of native coronary artery with other forms of angina pectoris: Secondary | ICD-10-CM | POA: Diagnosis not present

## 2022-05-28 DIAGNOSIS — I10 Essential (primary) hypertension: Secondary | ICD-10-CM | POA: Diagnosis present

## 2022-05-28 DIAGNOSIS — R55 Syncope and collapse: Secondary | ICD-10-CM | POA: Insufficient documentation

## 2022-05-28 MED ORDER — CARVEDILOL 6.25 MG PO TABS: 6.2500 mg | ORAL_TABLET | Freq: Every day | ORAL | 3 refills | Status: AC

## 2022-05-28 NOTE — Telephone Encounter (Signed)
Patient given detailed instructions per Myocardial Perfusion Study Information Sheet for the test on 06/03/2022 at 7:45. Patient notified to arrive 15 minutes early and that it is imperative to arrive on time for appointment to keep from having the test rescheduled.  If you need to cancel or reschedule your appointment, please call the office within 24 hours of your appointment. . Patient verbalized understanding.Jason Bauer

## 2022-05-28 NOTE — Patient Instructions (Signed)
Medication Instructions:  Your physician has recommended you make the following change in your medication:   START: Carvedilol 6.25 mg daily  *If you need a refill on your cardiac medications before your next appointment, please call your pharmacy*   Lab Work: None If you have labs (blood work) drawn today and your tests are completely normal, you will receive your results only by: [place] (if you have MyChart) OR A paper copy in the mail If you have any lab test that is abnormal or we need to change your treatment, we will call you to review the results.   Testing/Procedures:   [facility] [address] [place], [place] [address] Phone:  [phone]    Please arrive 15 minutes prior to your appointment time for registration and insurance purposes.  The test will take approximately 3 to 4 hours to complete; you may bring reading material.  If someone comes with you to your appointment, they will need to remain in the main lobby due to limited space in the testing area. **If you are pregnant or breastfeeding, please notify the nuclear lab prior to your appointment**  How to prepare for your Myocardial Perfusion Test: Do not eat or drink 3 hours prior to your test, except you may have water. Do not consume products containing caffeine (regular or decaffeinated) 12 hours prior to your test. (ex: coffee, chocolate, sodas, tea). Do bring a list of your current medications with you.  If not listed below, you may take your medications as normal. Do wear comfortable clothes (no dresses or overalls) and walking shoes, tennis shoes preferred (No heels or open toe shoes are allowed). Do NOT wear cologne, perfume, aftershave, or lotions (deodorant is allowed). If these instructions are not followed, your test will have to be rescheduled.  Please report to [address] for your test.  If you have questions or concerns about your appointment, you  can call the [facility] at [phone].  If you cannot keep your appointment, please provide 24 hours notification to the Nuclear Lab, to avoid a possible $50 charge to your account.    Follow-Up: At [facility], you and your health needs are our priority.  As part of our continuing mission to provide you with exceptional heart care, we have created designated Provider Care Teams.  These Care Teams include your primary Cardiologist (physician) and Advanced Practice Providers (APPs -  Physician Assistants and Nurse Practitioners) who all work together to provide you with the care you need, when you need it.  We recommend signing up for the patient portal called "MyChart".  Sign up information is provided on this After Visit Summary.  MyChart is used to connect with patients for Virtual Visits (Telemedicine).  Patients are able to view lab/test results, encounter notes, upcoming appointments, etc.  Non-urgent messages can be sent to your provider as well.   To learn more about what you can do with MyChart, go to [email or web address].    Your next appointment:   6 month(s)  Provider:   Shirlee More, MD    Other Instructions Send a list of blood pressures sitting and standing twice a day by MyChart

## 2022-06-03 ENCOUNTER — Ambulatory Visit: Payer: Medicare Other | Attending: Cardiology

## 2022-06-03 DIAGNOSIS — I4510 Unspecified right bundle-branch block: Secondary | ICD-10-CM | POA: Insufficient documentation

## 2022-06-03 DIAGNOSIS — R55 Syncope and collapse: Secondary | ICD-10-CM | POA: Diagnosis not present

## 2022-06-03 DIAGNOSIS — I25118 Atherosclerotic heart disease of native coronary artery with other forms of angina pectoris: Secondary | ICD-10-CM

## 2022-06-03 DIAGNOSIS — R001 Bradycardia, unspecified: Secondary | ICD-10-CM | POA: Diagnosis not present

## 2022-06-03 DIAGNOSIS — I10 Essential (primary) hypertension: Secondary | ICD-10-CM | POA: Diagnosis present

## 2022-06-03 MED ORDER — TECHNETIUM TC 99M TETROFOSMIN IV KIT
32.1000 | PACK | Freq: Once | INTRAVENOUS | Status: AC | PRN
  Administered 2022-06-03: 32.1 via INTRAVENOUS

## 2022-06-03 MED ORDER — TECHNETIUM TC 99M TETROFOSMIN IV KIT
10.1000 | PACK | Freq: Once | INTRAVENOUS | Status: AC | PRN
  Administered 2022-06-03: 10.1 via INTRAVENOUS

## 2022-06-03 MED ORDER — REGADENOSON 0.4 MG/5ML IV SOLN
0.4000 mg | Freq: Once | INTRAVENOUS | Status: AC
  Administered 2022-06-03: 0.4 mg via INTRAVENOUS

## 2022-06-04 LAB — MYOCARDIAL PERFUSION IMAGING
LV dias vol: 115 mL (ref 62–150)
LV sys vol: 39 mL
Nuc Stress EF: 66 %
Peak HR: 54 {beats}/min
Rest HR: 44 {beats}/min
Rest Nuclear Isotope Dose: 10.1 mCi
SDS: 0
SRS: 1
SSS: 1
ST Depression (mm): 0 mm
Stress Nuclear Isotope Dose: 32.1 mCi
TID: 1.04

## 2022-12-21 IMAGING — MR MR ANKLE*R* W/O CM
5 series · 38 of 40 positions shown · non-contrast
Comparison: None.

CLINICAL DATA: Right ankle injury 18 months ago. No pain or
weakness.

EXAM:
MRI OF THE RIGHT ANKLE WITHOUT CONTRAST
TECHNIQUE: Multiplanar, multisequence MR imaging of the ankle was performed. No
intravenous contrast was administered.

[Series 4: T2 fat-sat · axial · 3.0mm · 0.56mm/px · z∈[-108,+44]mm · 9 of 40 slices shown (1 of 2)]
[im 1/40]
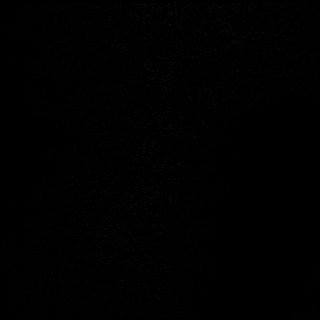
[im 5/40]
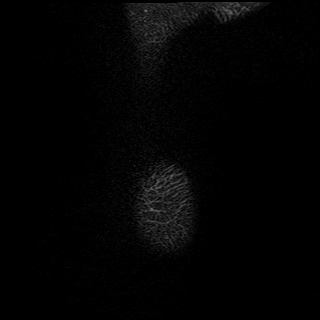
[im 10/40]
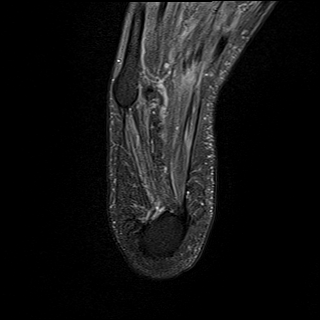
[im 15/40]
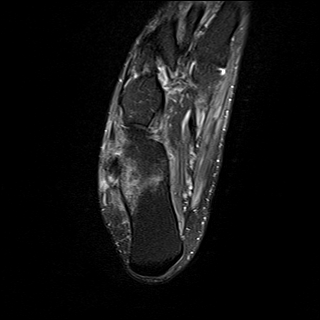
[im 20/40]
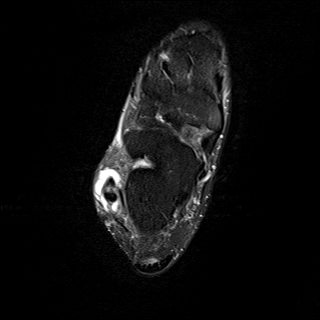
[im 25/40]
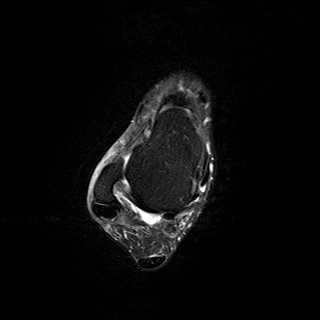
[im 30/40]
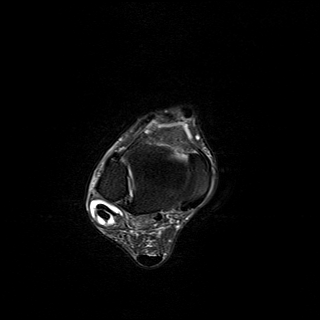
[im 35/40]
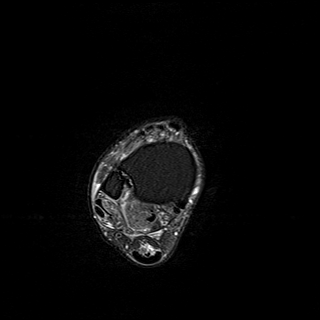
[im 40/40]
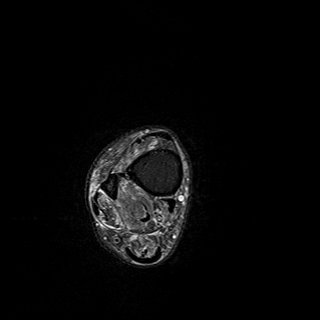

[Series 5: PD fat-sat · axial · 3.0mm · 0.56mm/px · z∈[-108,+44]mm · 9 of 40 slices shown]
[im 1/40]
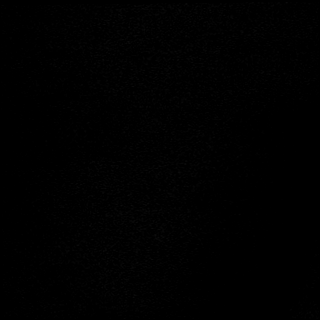
[im 5/40]
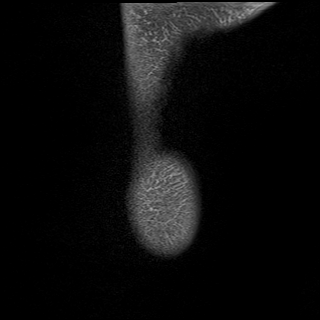
[im 10/40]
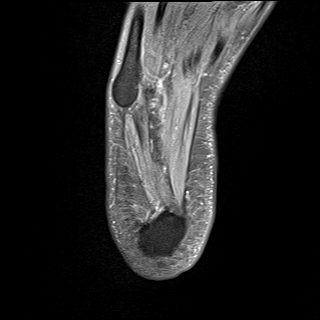
[im 15/40]
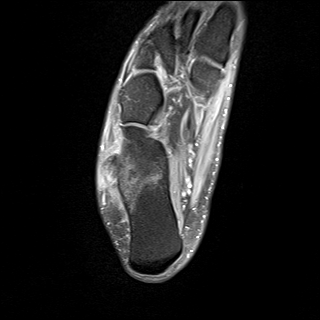
[im 20/40]
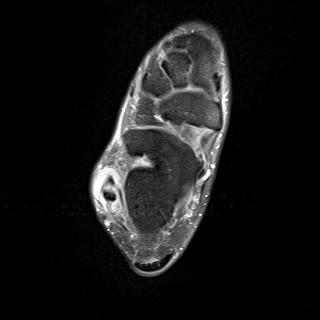
[im 25/40]
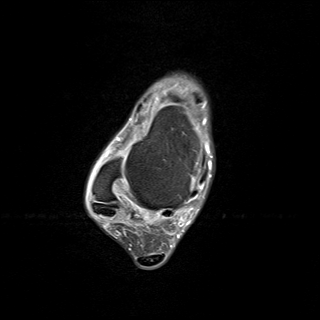
[im 30/40]
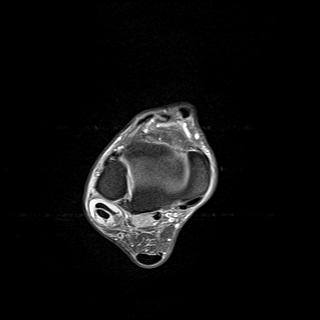
[im 35/40]
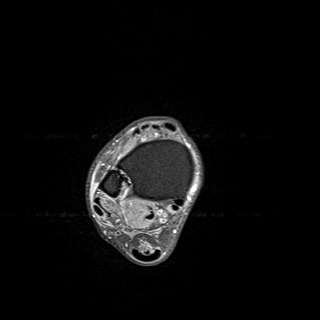
[im 40/40]
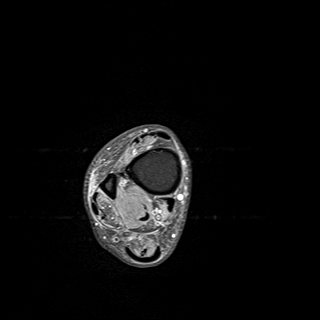

[Series 6: T1 · sagittal · 4.0mm · 0.56mm/px · 6 of 25 slices shown]
[im 1/25]
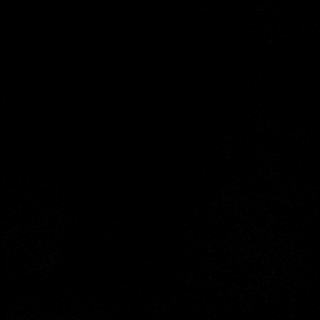
[im 5/25]
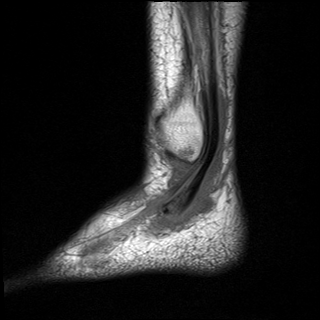
[im 10/25]
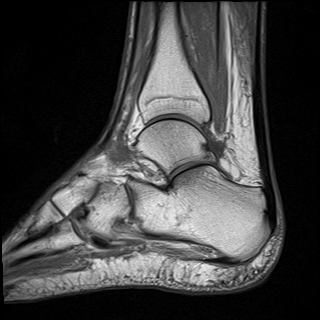
[im 15/25]
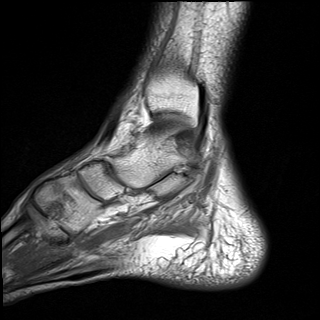
[im 20/25]
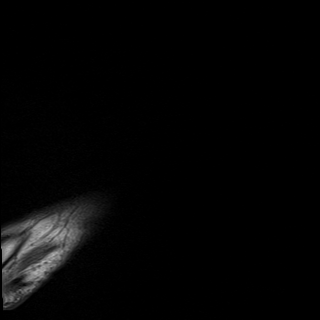
[im 25/25]
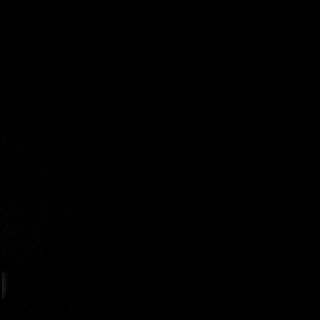

[Series 7: STIR · sagittal · 4.0mm · 0.35mm/px · 6 of 25 slices shown]
[im 1/25]
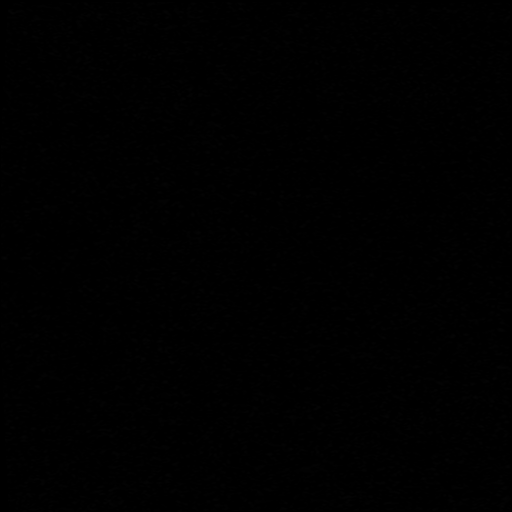
[im 5/25]
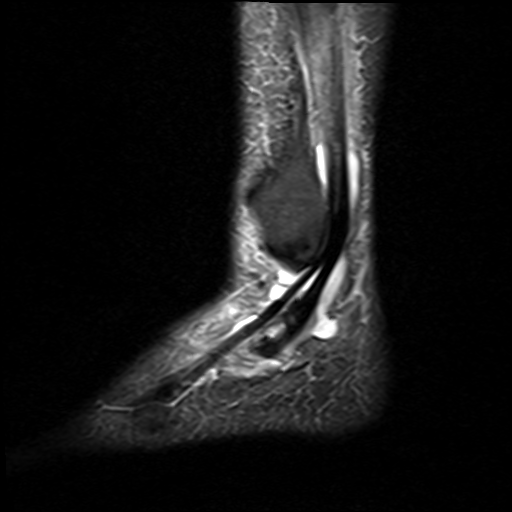
[im 10/25]
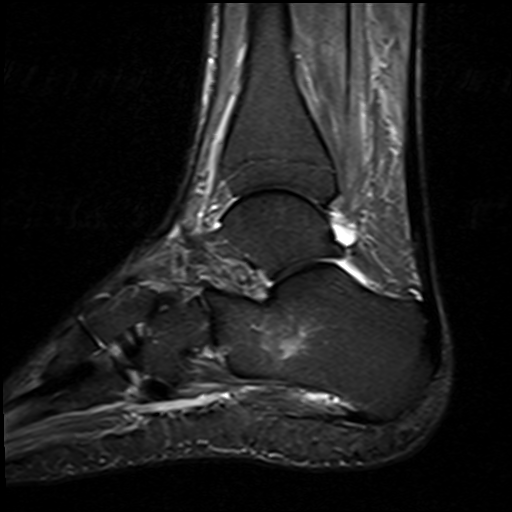
[im 15/25]
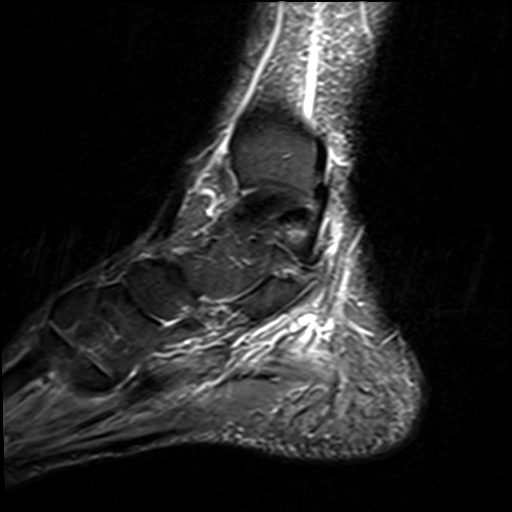
[im 20/25]
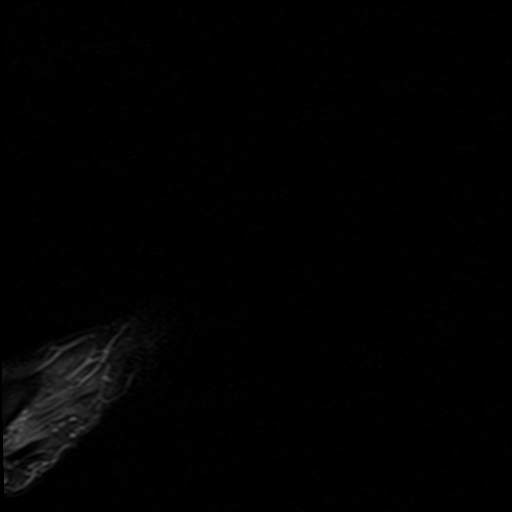
[im 25/25]
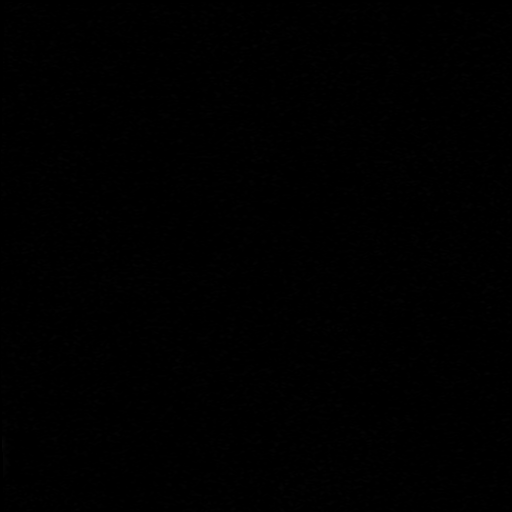

[Series 8: T2 fat-sat · coronal · 3.0mm · 0.53mm/px · 8 of 45 slices shown (2 of 2)]
[im 1/45]
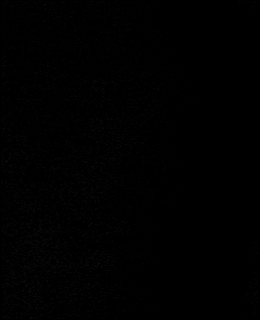
[im 5/45]
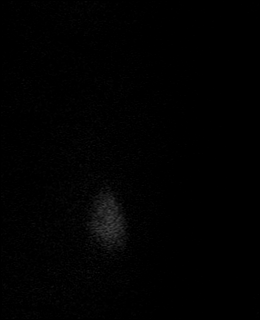
[im 15/45]
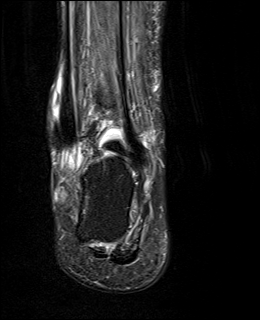
[im 20/45]
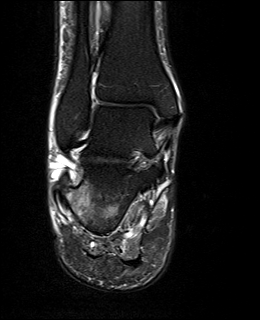
[im 25/45]
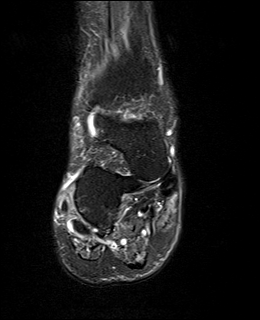
[im 30/45]
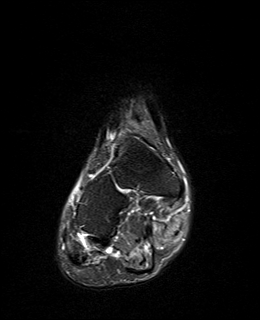
[im 40/45]
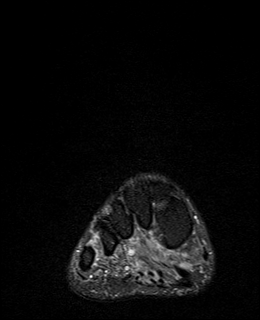
[im 45/45]
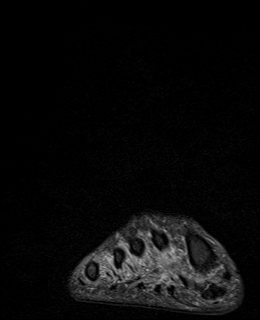

[38 of 40 positions shown; findings below may reference images not displayed]

FINDINGS: TENDONS

Peroneal: Moderate tendinosis of the peroneus longus with a
longitudinal split tear of the peroneal tubercle and severe
subcortical bone marrow edema. Moderate tendinosis of the peroneus
brevis. Severe peroneal tenosynovitis.

Posteromedial: Posterior tibial tendon intact. Flexor hallucis
longus tendon intact. Flexor digitorum longus tendon intact.

Anterior: Tibialis anterior tendon intact. Extensor hallucis longus
tendon intact Extensor digitorum longus tendon intact.

Achilles:  Intact.

Plantar Fascia: Intact. Small plantar calcaneal spur.

LIGAMENTS

Lateral: Anterior talofibular ligament intact. Calcaneofibular
ligament intact. Posterior talofibular ligament intact. Anterior and
posterior tibiofibular ligaments intact.

Medial: Deltoid ligament intact. Spring ligament intact.

CARTILAGE

Ankle Joint: No joint effusion. Normal ankle mortise. No chondral
defect.

Subtalar Joints/Sinus Tarsi: Normal subtalar joints. No subtalar
joint effusion. Normal sinus tarsi.

Bones: No fracture or dislocation.

Soft Tissue: No fluid collection or hematoma. Muscles are normal
without edema or atrophy. Tarsal tunnel is normal.
IMPRESSION: 1. Moderate tendinosis of the peroneus longus with a longitudinal
split tear at the level of the peroneal tubercle and severe
subcortical bone marrow edema. Moderate tendinosis of the peroneus
brevis. Severe peroneal tenosynovitis.

## 2023-05-25 ENCOUNTER — Encounter: Payer: Self-pay | Admitting: Cardiology

## 2023-05-25 DIAGNOSIS — F515 Nightmare disorder: Secondary | ICD-10-CM | POA: Insufficient documentation

## 2023-05-25 DIAGNOSIS — F4312 Post-traumatic stress disorder, chronic: Secondary | ICD-10-CM

## 2023-05-25 DIAGNOSIS — H353130 Nonexudative age-related macular degeneration, bilateral, stage unspecified: Secondary | ICD-10-CM

## 2023-05-25 HISTORY — DX: Nonexudative age-related macular degeneration, bilateral, stage unspecified: H35.3130

## 2023-05-25 HISTORY — DX: Post-traumatic stress disorder, chronic: F43.12

## 2023-05-25 NOTE — Progress Notes (Unsigned)
Cardiology Office Note:    Date:  05/26/2023   ID:  Jason Bauer, Jason Bauer 05/05/1945, MRN [id number]  PCP:  Street, Stephanie Coup, MD  Cardiologist:  Norman Herrlich, MD    Referring MD: [address], Stephanie Coup, *    ASSESSMENT:    1. Coronary artery disease of native artery of native heart with stable angina pectoris (HCC)   2. Essential hypertension   3. Mixed hyperlipidemia   4. RBBB    PLAN:    In order of problems listed above:  Mir continues to do well from a cardiology perspective after remote PCI and on current medical therapy antiplatelet therapy his high intensity statin and beta-blocker.  I would not advise a repeat ischemia evaluation at this time Well-controlled some variable blood pressure post COVID not on usual discussed good technique and recording blood pressures Good response he will continue his current high intensity statin through the [facility] Stable EKG pattern without progressive conduction system disease  Next appointment: 1 year   Medication Adjustments/Labs and Tests Ordered: Current medicines are reviewed at length with the patient today.  Concerns regarding medicines are outlined above.  Orders Placed This Encounter  Procedures   EKG 12-Lead   No orders of the defined types were placed in this encounter.    History of Present Illness:    Jason Bauer is a 78 y.o. male with a hx of CAD with PCI and stent left anterior descending coronary artery 2003 hypertension hyperlipidemia right bundle branch block and laparoscopic left radical nephrectomy August 2021 last seen 05/28/2022.  Compliance with diet, lifestyle and medications: Yes  Overall is done well he had a second bout of mild COVID the beginning of January Blood pressures are typically less than 130 and 140 less than 80 After COVID he noted variation in blood pressure not uncommon we discussed good technique and when to check his blood pressure at home Repeat by me 140/62 He  is not having edema shortness of breath chest pain palpitation or syncope EKG with stable pattern right bundle branch block Recent labs [facility] creatinine 1.63 A1c 6.3 cholesterol 189 HDL 60 LDL 97 He tells rates his statin without muscle pain or weakness He continues to be surveyed after his renal cancer Past Medical History:  Diagnosis Date   Anginal pain (HCC)    Anxiety    Arthritis    Carcinoma of oropharynx (HCC) 07/30/2011   Chronic ischemic heart disease 05/30/2021   Chronic kidney disease    Coronary arteriosclerosis 05/30/2021   Coronary artery disease involving native coronary artery of native heart 08/01/2015   PCI and stent LAD 2003   Depression    Diverticulosis    Essential (primary) hypertension 08/01/2015   Essential hypertension 08/01/2015   Gastroesophageal reflux disease without esophagitis 05/30/2021   GERD (gastroesophageal reflux disease)    Hearing loss 05/30/2021   Hiatal hernia    Hyperlipidemia 08/01/2015   Hypothyroidism    Insomnia 05/30/2021   Malignant neoplasm of head, face, and neck (HCC) 05/30/2021   Neoplasm of left kidney 12/01/2019   Nightmares associated with chronic post-traumatic stress disorder 05/25/2023   Nonexudative age-related macular degeneration, bilateral, intermediate dry stage 05/30/2021   Nonexudative age-related macular degeneration, bilateral, stage unspecified 05/25/2023   Obstructive sleep apnea (adult) (pediatric) 05/30/2021   CPAP   Ocular hypertension, bilateral 05/30/2021   Pain in left knee 05/30/2021   Peroneal tendonitis, right 06/23/2021   Preoperative cardiovascular examination 08/01/2015   Presbyopia 05/30/2021  PTSD (post-traumatic stress disorder)    Recurrent major depression (HCC) 05/30/2021   Sleep apnea    CPAP    Current Medications: Current Meds  Medication Sig   acetaminophen (TYLENOL) 500 MG tablet Take 500 mg by mouth 2 (two) times daily.   ALPRAZolam (XANAX) 0.25 MG tablet Take 0.25 mg  by mouth 3 (three) times daily as needed.   aspirin EC 81 MG tablet Take 81 mg by mouth once.   atorvastatin (LIPITOR) 10 MG tablet Take 10 mg by mouth daily.   carvedilol (COREG) 6.25 MG tablet Take 1 tablet (6.25 mg total) by mouth daily.   cholecalciferol (VITAMIN D3) 10 MCG (400 UNIT) TABS tablet Take 400 Units by mouth daily.   citalopram (CELEXA) 10 MG tablet Take 10 mg by mouth daily.   clopidogrel (PLAVIX) 75 MG tablet Take 1 tablet (75 mg total) by mouth daily.   Coenzyme Q10 (CO Q 10) 100 MG CAPS Take 100 mg by mouth daily.   hydrOXYzine (ATARAX) 10 MG tablet Take 10 mg by mouth 2 (two) times daily as needed for anxiety.   ketotifen (ZADITOR) 0.035 % ophthalmic solution Apply 1 drop to eye 2 (two) times daily.   levothyroxine (SYNTHROID) 112 MCG tablet Take 112 mcg by mouth daily before breakfast.   lisinopril (ZESTRIL) 20 MG tablet Take 1 tablet (20 mg total) by mouth daily.   Magnesium Oxide 420 MG TABS Take 840 mg by mouth at bedtime.    Nutritional Supplements (JUICE PLUS FIBRE PO) Take 4 tablets by mouth at bedtime. Fruit (2) + Vegetable (2)   pantoprazole (PROTONIX) 40 MG tablet Take 40 mg by mouth daily before breakfast.    sertraline (ZOLOFT) 50 MG tablet Take 50 mg by mouth 2 (two) times daily.   sildenafil (VIAGRA) 100 MG tablet Take 100 mg by mouth as needed for erectile dysfunction.      EKGs/Labs/Other Studies Reviewed:    The following studies were reviewed today:  Cardiac Studies & Procedures     STRESS TESTS  MYOCARDIAL PERFUSION IMAGING 06/03/2022  Narrative   The study is normal. The study is low risk.   No ST deviation was noted.   Left ventricular function is normal. Nuclear stress EF: 66 %. The left ventricular ejection fraction is hyperdynamic (>65%). End diastolic cavity size is normal.   Prior study not available for comparison.              EKG Interpretation Date/Time:  Tuesday May 26 2023 13:04:32 EST Ventricular Rate:  52 PR  Interval:  154 QRS Duration:  150 QT Interval:  452 QTC Calculation: 420 R Axis:   14  Text Interpretation: Sinus bradycardia Right bundle branch block When compared with ECG of 16-Nov-2019 13:54, No significant change was found Confirmed by Norman Herrlich ([id number]) on 05/26/2023 1:08:57 PM   Recent Labs: No results found for requested labs within last 365 days.  Recent Lipid Panel    Component Value Date/Time   CHOL 153 10/31/2020 1502   TRIG 147 10/31/2020 1502   HDL 55 10/31/2020 1502   CHOLHDL 2.8 10/31/2020 1502   LDLCALC 73 10/31/2020 1502    Physical Exam:    VS:  BP (!) 140/62   Pulse (!) 52   Ht 5\' 10"  (1.778 m)   Wt 191 lb 3.2 oz (86.7 kg)   SpO2 99%   BMI 27.43 kg/m     Wt Readings from Last 3 Encounters:  05/26/23 191 lb 3.2 oz (86.7  kg)  06/03/22 181 lb (82.1 kg)  05/28/22 181 lb 1.6 oz (82.1 kg)     GEN:  Well nourished, well developed in no acute distress HEENT: Normal NECK: No JVD; No carotid bruits LYMPHATICS: No lymphadenopathy CARDIAC: RRR, no murmurs, rubs, gallops RESPIRATORY:  Clear to auscultation without rales, wheezing or rhonchi  ABDOMEN: Soft, non-tender, non-distended MUSCULOSKELETAL:  No edema; No deformity  SKIN: Warm and dry NEUROLOGIC:  Alert and oriented x 3 PSYCHIATRIC:  Normal affect    Signed, Norman Herrlich, MD  05/26/2023 1:28 PM    [facility]

## 2023-05-26 ENCOUNTER — Encounter: Payer: Self-pay | Admitting: Cardiology

## 2023-05-26 ENCOUNTER — Ambulatory Visit: Payer: Medicare Other | Attending: Cardiology | Admitting: Cardiology

## 2023-05-26 VITALS — BP 140/62 | HR 52 | Ht 70.0 in | Wt 191.2 lb

## 2023-05-26 DIAGNOSIS — E782 Mixed hyperlipidemia: Secondary | ICD-10-CM | POA: Diagnosis present

## 2023-05-26 DIAGNOSIS — I10 Essential (primary) hypertension: Secondary | ICD-10-CM | POA: Diagnosis present

## 2023-05-26 DIAGNOSIS — I25118 Atherosclerotic heart disease of native coronary artery with other forms of angina pectoris: Secondary | ICD-10-CM | POA: Insufficient documentation

## 2023-05-26 DIAGNOSIS — I4510 Unspecified right bundle-branch block: Secondary | ICD-10-CM | POA: Insufficient documentation

## 2023-05-26 NOTE — Patient Instructions (Signed)
Medication Instructions:  Your physician recommends that you continue on your current medications as directed. Please refer to the Current Medication list given to you today.  *If you need a refill on your cardiac medications before your next appointment, please call your pharmacy*   Lab Work: None If you have labs (blood work) drawn today and your tests are completely normal, you will receive your results only by: MyChart Message (if you have MyChart) OR A paper copy in the mail If you have any lab test that is abnormal or we need to change your treatment, we will call you to review the results.   Testing/Procedures: None   Follow-Up: At [facility], you and your health needs are our priority.  As part of our continuing mission to provide you with exceptional heart care, we have created designated Provider Care Teams.  These Care Teams include your primary Cardiologist (physician) and Advanced Practice Providers (APPs -  Physician Assistants and Nurse Practitioners) who all work together to provide you with the care you need, when you need it.  We recommend signing up for the patient portal called "MyChart".  Sign up information is provided on this After Visit Summary.  MyChart is used to connect with patients for Virtual Visits (Telemedicine).  Patients are able to view lab/test results, encounter notes, upcoming appointments, etc.  Non-urgent messages can be sent to your provider as well.   To learn more about what you can do with MyChart, go to [email or web address].    Your next appointment:   1 year(s)  Provider:   Norman Herrlich, MD    Other Instructions None

## 2023-09-14 ENCOUNTER — Encounter (HOSPITAL_BASED_OUTPATIENT_CLINIC_OR_DEPARTMENT_OTHER): Payer: Self-pay | Admitting: Emergency Medicine

## 2023-09-14 ENCOUNTER — Ambulatory Visit (HOSPITAL_BASED_OUTPATIENT_CLINIC_OR_DEPARTMENT_OTHER): Admission: EM | Admit: 2023-09-14 | Discharge: 2023-09-14 | Disposition: A | Discharge status: Home / Self Care

## 2023-09-14 DIAGNOSIS — S0181XA Laceration without foreign body of other part of head, initial encounter: Secondary | ICD-10-CM | POA: Diagnosis not present

## 2023-09-14 DIAGNOSIS — S0990XA Unspecified injury of head, initial encounter: Secondary | ICD-10-CM | POA: Diagnosis not present

## 2023-09-14 NOTE — ED Triage Notes (Addendum)
 Pt was walking in the back yard and just had concrete poured today so he was going around it on some rocks and his foot got caught and he face planted. Pt has 2 abrasions to forehead and one area to nose. Pt is on plavix .

## 2023-09-14 NOTE — Discharge Instructions (Signed)
 I have placed Steri-Strips on the area to keep it closed.  Keep covered for the next 24 hours.  You can take the bandage off tomorrow.  Keep clean and dry as possible. Keep antibiotic ointment on the open wound for the next few days.  Try to leave the Steri-Strips on as long as possible. Follow-up for any continued or worsening issues

## 2023-09-15 NOTE — ED Provider Notes (Signed)
 [facility]    CSN: [id number] Arrival date & time: 09/14/23  1612      History   Chief Complaint No chief complaint on file.   HPI Jason Bauer is a 78 y.o. male.   Patient is a 78 year old male presents today with fall and facial injury.  Patient reports he was walking the backyard where they just had concrete poured and his foot got caught on some rocks and he tripped landing on facial area.  He did have glasses on.  2 areas to forehead and nasal bridge.  He is not having a significant amount of pain.  The bleeding is controlled.  He denies any associated vision changes, dizziness, headache, nausea or any concerning symptoms.  He is on Plavix      Past Medical History:  Diagnosis Date   Anginal pain (HCC)    Anxiety    Arthritis    Carcinoma of oropharynx (HCC) 07/30/2011   Chronic ischemic heart disease 05/30/2021   Chronic kidney disease    Coronary arteriosclerosis 05/30/2021   Coronary artery disease involving native coronary artery of native heart 08/01/2015   PCI and stent LAD 2003   Depression    Diverticulosis    Essential (primary) hypertension 08/01/2015   Essential hypertension 08/01/2015   Gastroesophageal reflux disease without esophagitis 05/30/2021   GERD (gastroesophageal reflux disease)    Hearing loss 05/30/2021   Hiatal hernia    Hyperlipidemia 08/01/2015   Hypothyroidism    Insomnia 05/30/2021   Malignant neoplasm of head, face, and neck (HCC) 05/30/2021   Neoplasm of left kidney 12/01/2019   Nightmares associated with chronic post-traumatic stress disorder 05/25/2023   Nonexudative age-related macular degeneration, bilateral, intermediate dry stage 05/30/2021   Nonexudative age-related macular degeneration, bilateral, stage unspecified 05/25/2023   Obstructive sleep apnea (adult) (pediatric) 05/30/2021   CPAP   Ocular hypertension, bilateral 05/30/2021   Pain in left knee 05/30/2021   Peroneal tendonitis, right 06/23/2021    Preoperative cardiovascular examination 08/01/2015   Presbyopia 05/30/2021   PTSD (post-traumatic stress disorder)    Recurrent major depression (HCC) 05/30/2021   Sleep apnea    CPAP    Patient Active Problem List   Diagnosis Date Noted   Nightmares associated with chronic post-traumatic stress disorder 05/25/2023   Nonexudative age-related macular degeneration, bilateral, stage unspecified 05/25/2023   Peroneal tendonitis, right 06/23/2021   Anginal pain (HCC) 05/30/2021   Anxiety 05/30/2021   Arthritis 05/30/2021   Chronic kidney disease 05/30/2021   Depression 05/30/2021   Diverticulosis 05/30/2021   Gastroesophageal reflux disease without esophagitis 05/30/2021   Hiatal hernia 05/30/2021   Hypothyroidism 05/30/2021   PTSD (post-traumatic stress disorder) 05/30/2021   Obstructive sleep apnea (adult) (pediatric) 05/30/2021   Hearing loss 05/30/2021   Insomnia 05/30/2021   Malignant neoplasm of head, face, and neck (HCC) 05/30/2021   Nonexudative age-related macular degeneration, bilateral, intermediate dry stage 05/30/2021   Ocular hypertension, bilateral 05/30/2021   Pain in left knee 05/30/2021   Presbyopia 05/30/2021   Recurrent major depression (HCC) 05/30/2021   Chronic ischemic heart disease 05/30/2021   Coronary arteriosclerosis 05/30/2021   Neoplasm of left kidney 12/01/2019   Coronary artery disease involving native coronary artery of native heart 08/01/2015   Essential (primary) hypertension 08/01/2015   Hyperlipidemia 08/01/2015   Preoperative cardiovascular examination 08/01/2015   Carcinoma of oropharynx (HCC) 07/30/2011    Past Surgical History:  Procedure Laterality Date   ANAL FISTULOTOMY     ARTHROSCOPY KNEE W/ DRILLING Left  CARDIAC CATHETERIZATION  2003   CORONARY ANGIOPLASTY     CYSTOSCOPY/URETEROSCOPY/HOLMIUM LASER/STENT PLACEMENT Left 11/24/2019   Procedure: CYSTOSCOPY/RETROGRADE/URETEROSCOPY/STENT PLACEMENT;  Surgeon: Florencio Hunting, MD;   Location: [facility];  Service: Urology;  Laterality: Left;   HEMORRHOID SURGERY     LAPAROSCOPIC NEPHRECTOMY Left 12/01/2019   Procedure: LAPAROSCOPIC NEPHRECTOMY;  Surgeon: Florencio Hunting, MD;  Location: [facility];  Service: Urology;  Laterality: Left;  ONLY NEEDS 160 MIN   TONSILLECTOMY Left 2006       Home Medications    Prior to Admission medications   Medication Sig Start Date End Date Taking? Authorizing Provider  carvedilol  (COREG ) 6.25 MG tablet Take 1 tablet (6.25 mg total) by mouth daily. 05/28/22  Yes Hassan Links, MD  clopidogrel  (PLAVIX ) 75 MG tablet Take 1 tablet (75 mg total) by mouth daily. 05/31/21  Yes Hassan Links, MD  hydrOXYzine (ATARAX) 10 MG tablet Take 10 mg by mouth 2 (two) times daily as needed for anxiety. 05/05/23  Yes [provider]  levothyroxine  (SYNTHROID ) 112 MCG tablet Take 112 mcg by mouth daily before breakfast.   Yes [provider]  lisinopril  (ZESTRIL ) 20 MG tablet Take 1 tablet (20 mg total) by mouth daily. 05/31/21  Yes Hassan Links, MD  pantoprazole  (PROTONIX ) 40 MG tablet Take 40 mg by mouth daily before breakfast.  05/19/15  Yes [provider]  sertraline  (ZOLOFT ) 50 MG tablet Take 50 mg by mouth 2 (two) times daily.   Yes [provider]  acetaminophen  (TYLENOL ) 500 MG tablet Take 500 mg by mouth 2 (two) times daily. 04/30/23   [provider]  ALPRAZolam  (XANAX ) 0.25 MG tablet Take 0.25 mg by mouth 3 (three) times daily as needed. 07/30/11   [provider]  aspirin  EC 81 MG tablet Take 81 mg by mouth once. 07/30/11   [provider]  atorvastatin  (LIPITOR) 10 MG tablet Take 10 mg by mouth daily. 07/30/11   [provider]  cholecalciferol (VITAMIN D3) 10 MCG (400 UNIT) TABS tablet Take 400 Units by mouth daily.    [provider]  citalopram (CELEXA) 10 MG tablet Take 10 mg by mouth daily. 07/30/11   [provider]  Coenzyme Q10 (CO Q 10) 100 MG CAPS Take 100 mg by  mouth daily.    [provider]  ketotifen  (ZADITOR ) 0.035 % ophthalmic solution Apply 1 drop to eye 2 (two) times daily. 03/31/23   [provider]  Magnesium Oxide 420 MG TABS Take 840 mg by mouth at bedtime.     [provider]  Nutritional Supplements (JUICE PLUS FIBRE PO) Take 4 tablets by mouth at bedtime. Fruit (2) + Vegetable (2)    [provider]  rosuvastatin  (CRESTOR ) 5 MG tablet Take 1 tablet (5 mg total) by mouth daily. 05/31/21 05/28/22  Hassan Links, MD  sildenafil (VIAGRA) 100 MG tablet Take 100 mg by mouth as needed for erectile dysfunction. 04/30/23   [provider]    Family History Family History  Problem Relation Age of Onset   Cancer Father    CAD Father    Hypertension Father    Bladder Cancer Father    Scoliosis Mother    Diabetes Paternal Grandfather     Social History Social History   Tobacco Use   Smoking status: Never    Passive exposure: Past   Smokeless tobacco: Never  Vaping Use   Vaping status: Never Used  Substance Use Topics   Alcohol  use: Not Currently   Drug use: Never     Allergies   Prazosin   Review of Systems Review of Systems  See HPI Physical Exam Triage Vital Signs ED Triage Vitals  Encounter Vitals Group     BP 09/14/23 1621 (!) 193/46     Systolic BP Percentile --      Diastolic BP Percentile --      Pulse Rate 09/14/23 1621 (!) 56     Resp 09/14/23 1621 18     Temp 09/14/23 1621 98.4 F (36.9 C)     Temp Source 09/14/23 1621 Oral     SpO2 09/14/23 1621 96 %     Weight --      Height --      Head Circumference --      Peak Flow --      Pain Score 09/14/23 1619 0     Pain Loc --      Pain Education --      Exclude from Growth Chart --    No data found.  Updated Vital Signs BP (!) 193/46 (BP Location: Right Arm)   Pulse (!) 56   Temp 98.4 F (36.9 C) (Oral)   Resp 18   SpO2 96%   Visual Acuity Right Eye Distance:   Left Eye Distance:   Bilateral  Distance:    Right Eye Near:   Left Eye Near:    Bilateral Near:     Physical Exam Constitutional:      Appearance: Normal appearance.  HENT:     Head: Normocephalic.      Nose:     Comments: Abrasion to nasal bridge.  Eyes:     Extraocular Movements: Extraocular movements intact.     Conjunctiva/sclera: Conjunctivae normal.     Pupils: Pupils are equal, round, and reactive to light.  Pulmonary:     Effort: Pulmonary effort is normal.  Musculoskeletal:        General: Normal range of motion.  Skin:    General: Skin is warm and dry.  Neurological:     General: No focal deficit present.     Mental Status: He is alert.  Psychiatric:        Mood and Affect: Mood normal.      UC Treatments / Results  Labs (all labs ordered are listed, but only abnormal results are displayed) Labs Reviewed - No data to display  EKG   Radiology No results found.  Procedures Procedures (including critical care time)  Medications Ordered in UC Medications - No data to display  Initial Impression / Assessment and Plan / UC Course  I have reviewed the triage vital signs and the nursing notes.  Pertinent labs & imaging results that were available during my care of the patient were reviewed by me and considered in my medical decision making (see chart for details).     Head injury with facial laceration and abrasions.  Wounds cleaned here in clinic.  Applied Steri-Strips to superficial laceration to forehead.  Cover with nonadherent dressing And keep areas clean and dry and use antibiotic ointment to prevent infection. Monitor for any worsening symptoms to include changes in vision, dizziness, headache, nausea or any other concussive type symptoms. Recommend go to the ER if this happens. Follow-up as needed Final Clinical Impressions(s) / UC Diagnoses   Final diagnoses:  Injury of head, initial encounter  Facial laceration, initial encounter     Discharge Instructions  I have placed Steri-Strips on the area to keep it closed.  Keep covered for the next 24 hours.  You can take the bandage off tomorrow.  Keep clean and dry as possible. Keep antibiotic ointment on the open wound for the next few days.  Try to leave the Steri-Strips on as long as possible. Follow-up for any continued or worsening issues  ED Prescriptions   None    PDMP not reviewed this encounter.   Landa Pine, FNP 09/15/23 1016
